# Patient Record
Sex: Female | Born: 1985 | Race: Black or African American | Hispanic: No | Marital: Single | Smoking: Never smoker
Health system: Southern US, Community
[De-identification: ages and names within clinical notes are randomized; demographics above are authoritative.]

## PROBLEM LIST (undated history)

## (undated) DIAGNOSIS — Z87898 Personal history of other specified conditions: Secondary | ICD-10-CM

## (undated) DIAGNOSIS — E1169 Type 2 diabetes mellitus with other specified complication: Secondary | ICD-10-CM

## (undated) DIAGNOSIS — E669 Obesity, unspecified: Secondary | ICD-10-CM

## (undated) DIAGNOSIS — B019 Varicella without complication: Secondary | ICD-10-CM

## (undated) DIAGNOSIS — I10 Essential (primary) hypertension: Secondary | ICD-10-CM

## (undated) HISTORY — DX: Obesity, unspecified: E66.9

## (undated) HISTORY — DX: Personal history of other specified conditions: Z87.898

## (undated) HISTORY — DX: Type 2 diabetes mellitus with other specified complication: E11.69

## (undated) HISTORY — DX: Varicella without complication: B01.9

## (undated) HISTORY — DX: Essential (primary) hypertension: I10

## (undated) HISTORY — PX: NO PAST SURGERIES: SHX2092

---

## 2012-07-29 ENCOUNTER — Encounter: Payer: Self-pay | Admitting: Family Medicine

## 2012-10-01 ENCOUNTER — Encounter: Payer: Self-pay | Admitting: Family Medicine

## 2012-10-01 ENCOUNTER — Ambulatory Visit (INDEPENDENT_AMBULATORY_CARE_PROVIDER_SITE_OTHER): Payer: 59 | Admitting: Family Medicine

## 2012-10-01 VITALS — BP 130/80 | HR 93 | Temp 98.5°F | Ht 65.25 in | Wt 204.6 lb

## 2012-10-01 DIAGNOSIS — Z0000 Encounter for general adult medical examination without abnormal findings: Secondary | ICD-10-CM | POA: Insufficient documentation

## 2012-10-01 LAB — LIPID PANEL
Cholesterol: 153 mg/dL (ref 0–200)
HDL: 33.3 mg/dL — ABNORMAL LOW (ref >39.00)
LDL Cholesterol: 107 mg/dL — ABNORMAL HIGH (ref 0–99)
Total CHOL/HDL Ratio: 5
Triglycerides: 64 mg/dL (ref 0.0–149.0)
VLDL: 12.8 mg/dL (ref 0.0–40.0)

## 2012-10-01 LAB — CBC WITH DIFFERENTIAL/PLATELET
Basophils Absolute: 0 10*3/uL (ref 0.0–0.1)
Basophils Relative: 0.3 % (ref 0.0–3.0)
Eosinophils Absolute: 0.1 10*3/uL (ref 0.0–0.7)
Eosinophils Relative: 1.1 % (ref 0.0–5.0)
HCT: 32.6 % — ABNORMAL LOW (ref 36.0–46.0)
Hemoglobin: 11 g/dL — ABNORMAL LOW (ref 12.0–15.0)
Lymphocytes Relative: 22 % (ref 12.0–46.0)
Lymphs Abs: 1.2 10*3/uL (ref 0.7–4.0)
MCHC: 33.9 g/dL (ref 30.0–36.0)
MCV: 88.4 fl (ref 78.0–100.0)
Monocytes Absolute: 0.4 10*3/uL (ref 0.1–1.0)
Monocytes Relative: 7 % (ref 3.0–12.0)
Neutro Abs: 3.7 10*3/uL (ref 1.4–7.7)
Neutrophils Relative %: 69.6 % (ref 43.0–77.0)
Platelets: 316 10*3/uL (ref 150.0–400.0)
RBC: 3.68 Mil/uL — ABNORMAL LOW (ref 3.87–5.11)
RDW: 13.5 % (ref 11.5–14.6)
WBC: 5.3 10*3/uL (ref 4.5–10.5)

## 2012-10-01 LAB — BASIC METABOLIC PANEL
BUN: 10 mg/dL (ref 6–23)
CO2: 25 mEq/L (ref 19–32)
Calcium: 9 mg/dL (ref 8.4–10.5)
Chloride: 104 mEq/L (ref 96–112)
Creatinine, Ser: 0.9 mg/dL (ref 0.4–1.2)
GFR: 101.02 mL/min (ref >60.00)
Glucose, Bld: 117 mg/dL — ABNORMAL HIGH (ref 70–99)
Potassium: 3.4 mEq/L — ABNORMAL LOW (ref 3.5–5.1)
Sodium: 137 mEq/L (ref 135–145)

## 2012-10-01 LAB — HEPATIC FUNCTION PANEL
ALT: 23 U/L (ref 0–35)
AST: 23 U/L (ref 0–37)
Albumin: 3.8 g/dL (ref 3.5–5.2)
Alkaline Phosphatase: 56 U/L (ref 39–117)
Bilirubin, Direct: 0 mg/dL (ref 0.0–0.3)
Total Bilirubin: 0.4 mg/dL (ref 0.3–1.2)
Total Protein: 7.6 g/dL (ref 6.0–8.3)

## 2012-10-01 LAB — TSH: TSH: 0.5 u[IU]/mL (ref 0.35–5.50)

## 2012-10-01 LAB — HEMOGLOBIN A1C: Hgb A1c MFr Bld: 4.9 % (ref 4.6–6.5)

## 2012-10-01 NOTE — Assessment & Plan Note (Signed)
Pt's PE WNL w/ exception of obesity.  Check labs.  UTD on GYN.  Anticipatory guidance provided.  

## 2012-10-01 NOTE — Patient Instructions (Addendum)
Follow up in 6 months to recheck BP We'll notify you of your lab results and fax your form Try and limit the salt in your diet (will improve BP) and get regular exercise Call with any questions or concerns Keep up the good work! Welcome!  We're glad to have you!!!

## 2012-10-01 NOTE — Progress Notes (Signed)
  Subjective:    Patient ID: Laura Miller, female    DOB: 1985-10-28, 27 y.o.   MRN: [id number]  HPI New to establish.  Previous MD- none.  GYN- Tamela Oddi  CPE- no concerns   Review of Systems Patient reports no vision/ hearing changes, adenopathy,fever, weight change,  persistant/recurrent hoarseness , swallowing issues, chest pain, palpitations, edema, persistant/recurrent cough, hemoptysis, dyspnea (rest/exertional/paroxysmal nocturnal), gastrointestinal bleeding (melena, rectal bleeding), abdominal pain, significant heartburn, bowel changes, GU symptoms (dysuria, hematuria, incontinence), Gyn symptoms (abnormal  bleeding, pain),  syncope, focal weakness, memory loss, numbness & tingling, skin/hair/nail changes, abnormal bruising or bleeding, anxiety, or depression.     Objective:   Physical Exam General Appearance:    Alert, cooperative, no distress, appears stated age. overweight  Head:    Normocephalic, without obvious abnormality, atraumatic  Eyes:    PERRL, conjunctiva/corneas clear, EOM's intact, fundi    benign, both eyes  Ears:    Normal TM's and external ear canals, both ears  Nose:   Nares normal, septum midline, mucosa normal, no drainage    or sinus tenderness  Throat:   Lips, mucosa, and tongue normal; teeth and gums normal  Neck:   Supple, symmetrical, trachea midline, no adenopathy;    Thyroid: no enlargement/tenderness/nodules  Back:     Symmetric, no curvature, ROM normal, no CVA tenderness  Lungs:     Clear to auscultation bilaterally, respirations unlabored  Chest Wall:    No tenderness or deformity   Heart:    Regular rate and rhythm, S1 and S2 normal, no murmur, rub   or gallop  Breast Exam:    Deferred to GYN  Abdomen:     Soft, non-tender, bowel sounds active all four quadrants,    no masses, no organomegaly  Genitalia:    Deferred to GYN  Rectal:    Extremities:   Extremities normal, atraumatic, no cyanosis or edema  Pulses:   2+ and symmetric all  extremities  Skin:   Skin color, texture, turgor normal, no rashes or lesions  Lymph nodes:   Cervical, supraclavicular, and axillary nodes normal  Neurologic:   CNII-XII intact, normal strength, sensation and reflexes    throughout          Assessment & Plan:

## 2012-10-02 LAB — NICOTINE/COTININE METABOLITES: Cotinine: <10 ng/mL

## 2012-10-04 ENCOUNTER — Encounter: Payer: Self-pay | Admitting: *Deleted

## 2012-10-04 LAB — VITAMIN D 1,25 DIHYDROXY
Vitamin D 1, 25 (OH)2 Total: 67 pg/mL (ref 18–72)
Vitamin D2 1, 25 (OH)2: <8 pg/mL
Vitamin D3 1, 25 (OH)2: 67 pg/mL

## 2012-10-07 ENCOUNTER — Encounter: Payer: Self-pay | Admitting: Obstetrics & Gynecology

## 2012-10-07 ENCOUNTER — Ambulatory Visit (INDEPENDENT_AMBULATORY_CARE_PROVIDER_SITE_OTHER): Payer: 59 | Admitting: Obstetrics & Gynecology

## 2012-10-07 VITALS — BP 134/82 | HR 96 | Temp 98.0°F | Ht 65.0 in | Wt 204.0 lb

## 2012-10-07 DIAGNOSIS — Z975 Presence of (intrauterine) contraceptive device: Secondary | ICD-10-CM

## 2012-10-07 DIAGNOSIS — Z3202 Encounter for pregnancy test, result negative: Secondary | ICD-10-CM

## 2012-10-07 DIAGNOSIS — Z30430 Encounter for insertion of intrauterine contraceptive device: Secondary | ICD-10-CM

## 2012-10-07 LAB — POCT URINE PREGNANCY: Preg Test, Ur: NEGATIVE

## 2012-10-07 MED ORDER — DOXYCYCLINE HYCLATE 50 MG PO CAPS: 100.0000 mg | ORAL_CAPSULE | Freq: Two times a day (BID) | ORAL | Status: DC

## 2012-10-07 NOTE — Progress Notes (Signed)
IUD Insertion Procedure Note  Pre-operative Diagnosis: Desires contraception  Post-operative Diagnosis: same  Indications: contraception  Procedure Details  Urine pregnancy test was done and result was negative.  The risks (including infection, bleeding, pain, and uterine perforation) and benefits of the procedure were explained to the patient and Written informed consent was obtained.    Cervix cleansed with Betadine. Uterus sounded to 7 cm. IUD inserted . String visible and trimmed. Patient tolerated procedure well.  A Skyla IUD was placed.    Condition: Stable  Complications: None  Plan:  The patient was advised to call for any fever or for prolonged or severe pain or bleeding. She was advised to use OTC analgesics as needed for mild to moderate pain.

## 2012-11-14 ENCOUNTER — Encounter: Payer: Self-pay | Admitting: Obstetrics & Gynecology

## 2012-11-14 ENCOUNTER — Ambulatory Visit (INDEPENDENT_AMBULATORY_CARE_PROVIDER_SITE_OTHER): Payer: 59 | Admitting: Obstetrics & Gynecology

## 2012-11-14 VITALS — BP 144/81 | HR 103 | Temp 98.5°F | Ht 65.0 in | Wt 205.2 lb

## 2012-11-14 DIAGNOSIS — Z30431 Encounter for routine checking of intrauterine contraceptive device: Secondary | ICD-10-CM

## 2012-11-14 NOTE — Patient Instructions (Signed)
Intrauterine Device Insertion Care After Refer to this sheet in the next few weeks. These instructions provide you with information on caring for yourself after your procedure. Your caregiver may also give you more specific instructions. Your treatment has been planned according to current medical practices, but problems sometimes occur. Call your caregiver if you have any problems or questions after your procedure. HOME CARE INSTRUCTIONS   Only take over-the-counter or prescription medicines for pain, discomfort, or fever as directed by your caregiver. Do not use aspirin. This may increase bleeding.  Check your IUD to make sure it is in place before you resume sexual activity. You should be able to feel the strings. If you cannot feel the strings, something may be wrong. The IUD may have fallen out of the uterus, or the uterus may have been punctured (perforated) during placement. Also, if the strings are getting longer, it may mean that the IUD is being forced out of the uterus. You no longer have full protection from pregnancy if any of these problems occur.  You may resume sexual intercourse if you are not having problems with the IUD. The IUD is considered immediately effective.  You may resume normal activities.  Keep all follow-up appointments to be sure your IUD has remained in place. After the first exam, yearly exams are advised, unless you cannot feel the strings of your IUD.  Continue to check that the IUD is still in place by feeling for the strings after every menstrual period. SEEK MEDICAL CARE IF:   You have bleeding that is heavier or lasts longer than a normal menstrual cycle.  You have a fever.  You have increasing cramps or abdominal pain not relieved with medicine.  You have abdominal pain that does not seem to be related to the same area of earlier cramping and pain.  You are lightheaded, unusually weak, or faint.  You have abnormal vaginal discharge or  smells.  You have pain during sexual intercourse.  You cannot feel the IUD strings, or the IUD string has gotten longer.  You feel the IUD at the opening of the cervix in the vagina.  You think you are pregnant, or you miss your menstrual period.  The IUD string is hurting your sex partner. Document Released: 02/15/2011 Document Revised: 09/11/2011 Document Reviewed: 02/15/2011 Exit[employer] Patient Information 2013 [employer].  

## 2012-11-14 NOTE — Progress Notes (Signed)
Subjective:     Laura Miller is a 27 y.o. female here for follow up on IUD placement.  Current complaints:none.  Personal health questionnaire reviewed: not asked.   Gynecologic History Patient's last menstrual period was 11/08/2012. Contraception: IUD Last Pap: Oct 30,2013. Results were: normal Last mammogram: not indicated    The following portions of the patient's history were reviewed and updated as appropriate: allergies, current medications, past family history, past medical history, past social history, past surgical history and problem list.  Review of Systems Pertinent items are noted in HPI.    Objective:   SPEC: IUD strings seen Bimanual exam: uterus AV, NT; adnexa NT  Assessment:    Normal exam, s/p IUD placement   Plan:    Return prn

## 2012-11-15 ENCOUNTER — Ambulatory Visit: Payer: 59 | Admitting: Obstetrics & Gynecology

## 2012-11-18 ENCOUNTER — Ambulatory Visit: Payer: 59 | Admitting: Obstetrics & Gynecology

## 2013-04-07 ENCOUNTER — Ambulatory Visit (INDEPENDENT_AMBULATORY_CARE_PROVIDER_SITE_OTHER): Payer: 59 | Admitting: Family Medicine

## 2013-04-07 ENCOUNTER — Encounter: Payer: Self-pay | Admitting: Family Medicine

## 2013-04-07 VITALS — BP 126/82 | HR 76 | Temp 98.2°F | Resp 16 | Wt 201.2 lb

## 2013-04-07 DIAGNOSIS — E049 Nontoxic goiter, unspecified: Secondary | ICD-10-CM

## 2013-04-07 DIAGNOSIS — I10 Essential (primary) hypertension: Secondary | ICD-10-CM | POA: Insufficient documentation

## 2013-04-07 DIAGNOSIS — R030 Elevated blood-pressure reading, without diagnosis of hypertension: Secondary | ICD-10-CM

## 2013-04-07 DIAGNOSIS — IMO0001 Reserved for inherently not codable concepts without codable children: Secondary | ICD-10-CM

## 2013-04-07 DIAGNOSIS — E010 Iodine-deficiency related diffuse (endemic) goiter: Secondary | ICD-10-CM | POA: Insufficient documentation

## 2013-04-07 NOTE — Assessment & Plan Note (Signed)
New.  TSH was normal at last visit.  Get [place] to assess.  Will follow.

## 2013-04-07 NOTE — Patient Instructions (Addendum)
Schedule your complete physical in 6 months We'll call you with your [place] appt and notify you of the results Call with any questions or concerns You look great!  Keep it up! Happy Fall!!!

## 2013-04-07 NOTE — Progress Notes (Signed)
  Subjective:    Patient ID: Laura Miller, female    DOB: Nov 13, 1985, 27 y.o.   MRN: [id number]  HPI Elevated BP- normal today.  Pt has new job and feels that this made a huge difference in stress.  No CP, SOB, HAs, visual changes, edema.  'i feel good'.   Review of Systems For ROS see HPI     Objective:   Physical Exam  Vitals reviewed. Constitutional: She is oriented to person, place, and time. She appears well-developed and well-nourished. No distress.  HENT:  Head: Normocephalic and atraumatic.  Eyes: Conjunctivae and EOM are normal. Pupils are equal, round, and reactive to light.  Neck: Normal range of motion. Neck supple. Thyromegaly (diffuse enlargement w/out nodularity) present.  Cardiovascular: Normal rate, regular rhythm, normal heart sounds and intact distal pulses.   No murmur heard. Pulmonary/Chest: Effort normal and breath sounds normal. No respiratory distress.  Abdominal: Soft. She exhibits no distension. There is no tenderness.  Musculoskeletal: She exhibits no edema.  Lymphadenopathy:    She has no cervical adenopathy.  Neurological: She is alert and oriented to person, place, and time.  Skin: Skin is warm and dry.  Psychiatric: She has a normal mood and affect. Her behavior is normal.          Assessment & Plan:

## 2013-04-07 NOTE — Assessment & Plan Note (Signed)
BP normal today.  Pt feels that getting her new job that is less stressful and more enjoyable has made a big difference.  Will continue to follow.

## 2013-04-08 ENCOUNTER — Ambulatory Visit (HOSPITAL_BASED_OUTPATIENT_CLINIC_OR_DEPARTMENT_OTHER)
Admission: RE | Admit: 2013-04-08 | Discharge: 2013-04-08 | Disposition: A | Discharge status: Home / Self Care | Payer: 59 | Source: Ambulatory Visit | Attending: Family Medicine | Admitting: Family Medicine

## 2013-04-08 ENCOUNTER — Ambulatory Visit (HOSPITAL_BASED_OUTPATIENT_CLINIC_OR_DEPARTMENT_OTHER): Payer: 59

## 2013-04-08 DIAGNOSIS — E010 Iodine-deficiency related diffuse (endemic) goiter: Secondary | ICD-10-CM

## 2013-04-08 DIAGNOSIS — E049 Nontoxic goiter, unspecified: Secondary | ICD-10-CM | POA: Insufficient documentation

## 2013-05-08 ENCOUNTER — Other Ambulatory Visit: Payer: Self-pay

## 2014-08-12 ENCOUNTER — Encounter: Payer: 59 | Admitting: Family Medicine

## 2014-11-04 ENCOUNTER — Encounter: Payer: Self-pay | Admitting: Family Medicine

## 2015-02-03 IMAGING — US US SOFT TISSUE HEAD/NECK
1 series · 14 of 17 positions shown · non-contrast
Comparison: None.

CLINICAL DATA: Thyromegaly. Patient is 26 years old.

EXAM:
THYROID ULTRASOUND
TECHNIQUE: Ultrasound examination of the thyroid gland and adjacent soft
tissues was performed.

[Series 1: us soft tissue head/neck · 0.08mm/px · 14 of 17 slices shown]
[im 1/17]
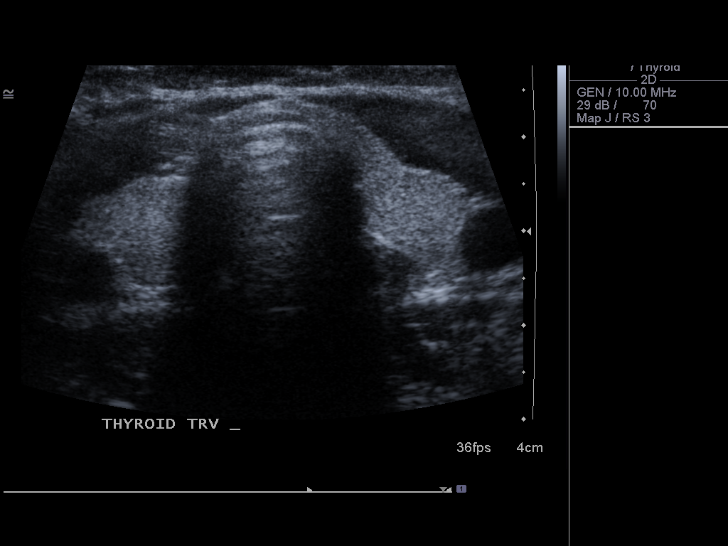
[im 2/17]
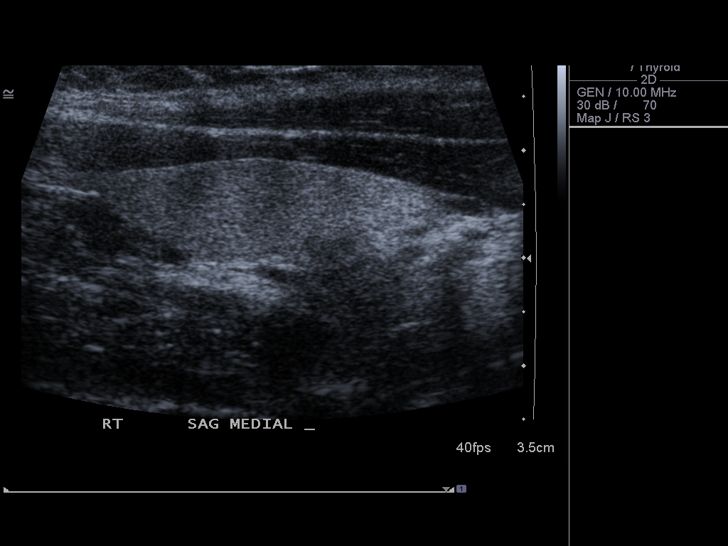
[im 4/17]
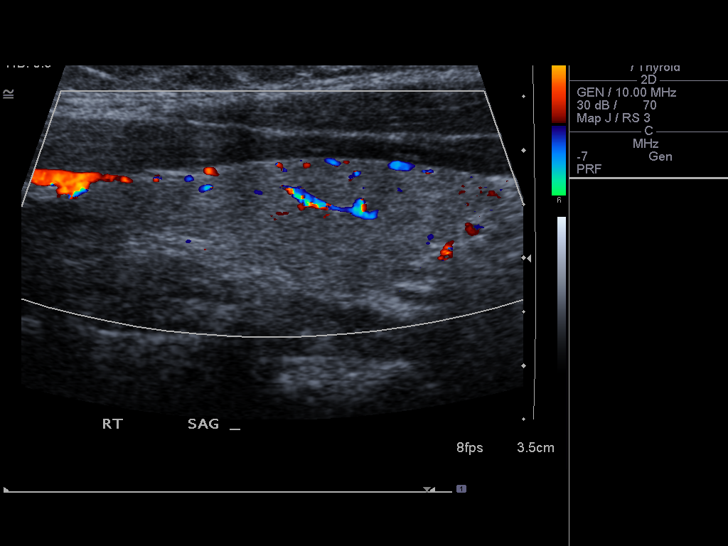
[im 5/17]
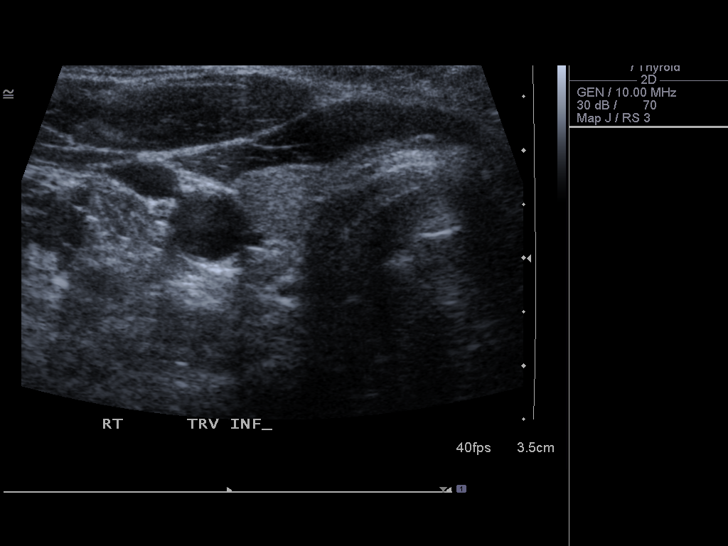
[im 6/17]
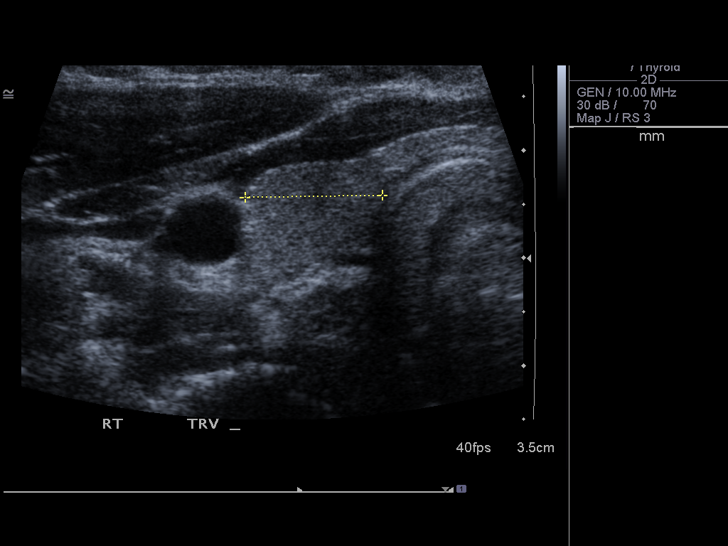
[im 7/17]
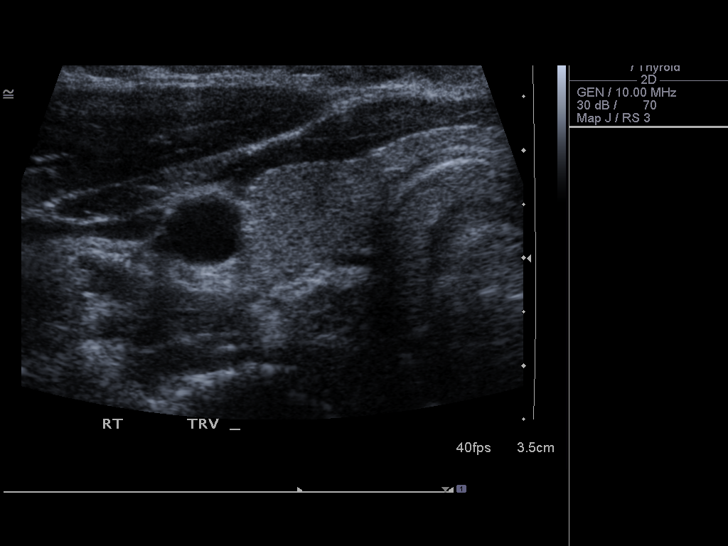
[im 8/17]
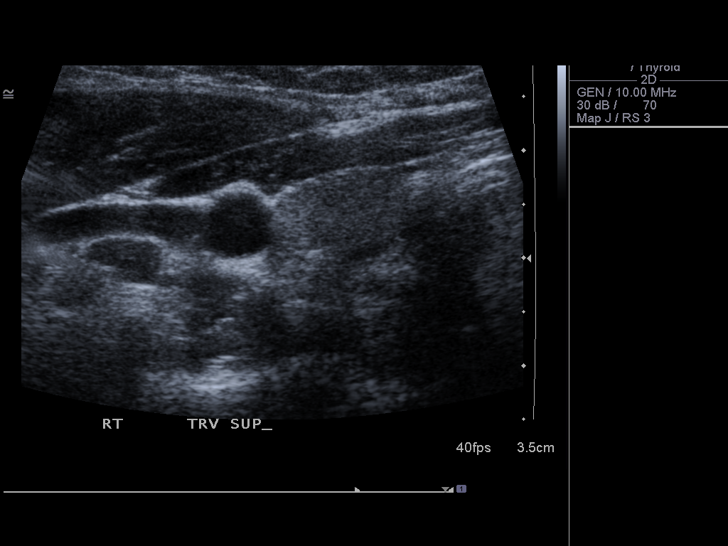
[im 10/17]
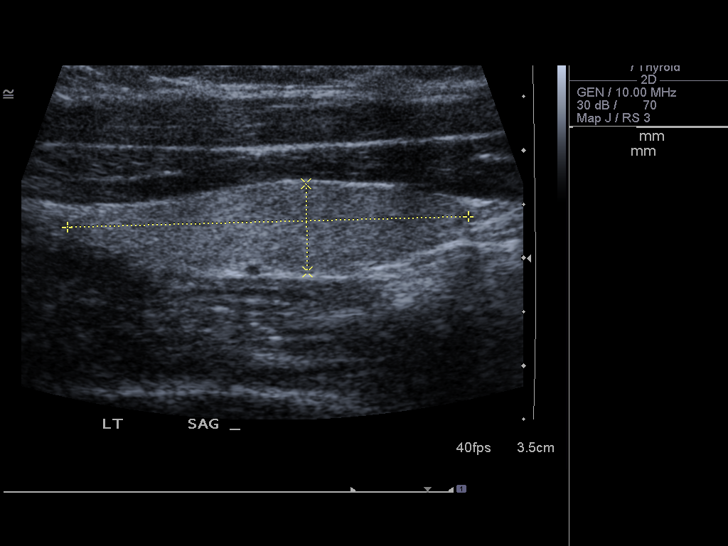
[im 11/17]
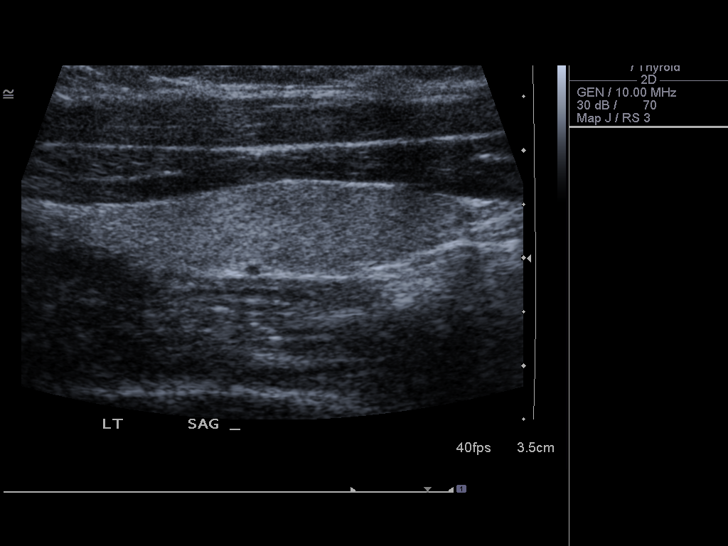
[im 12/17]
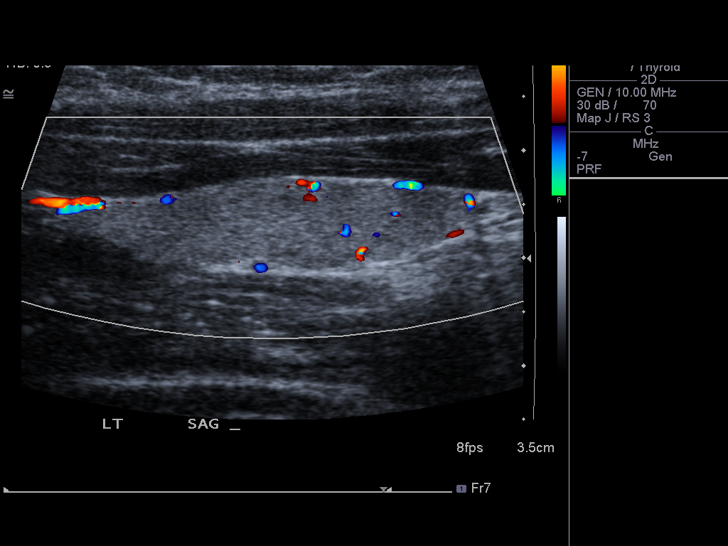
[im 13/17]
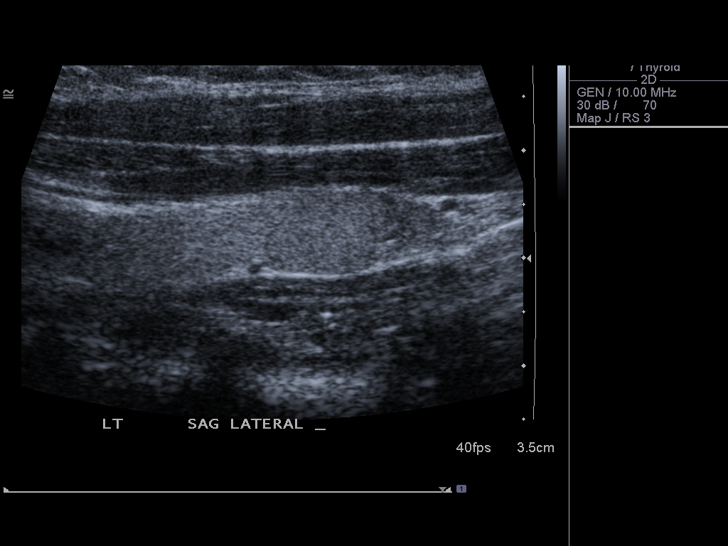
[im 14/17]
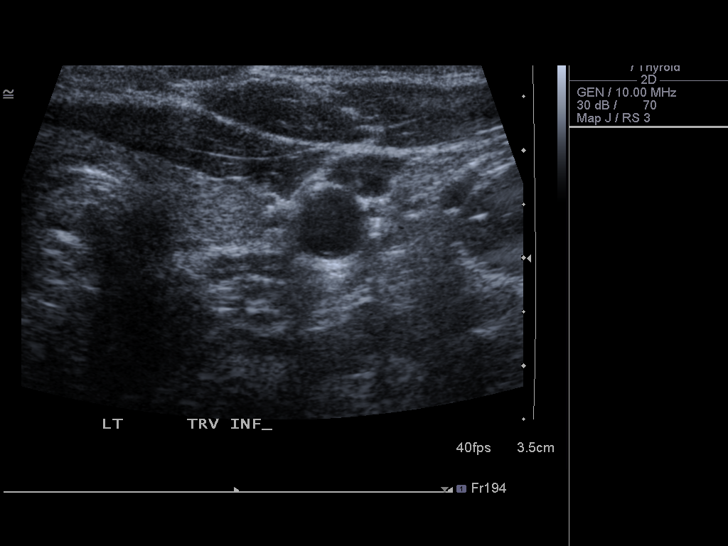
[im 16/17]
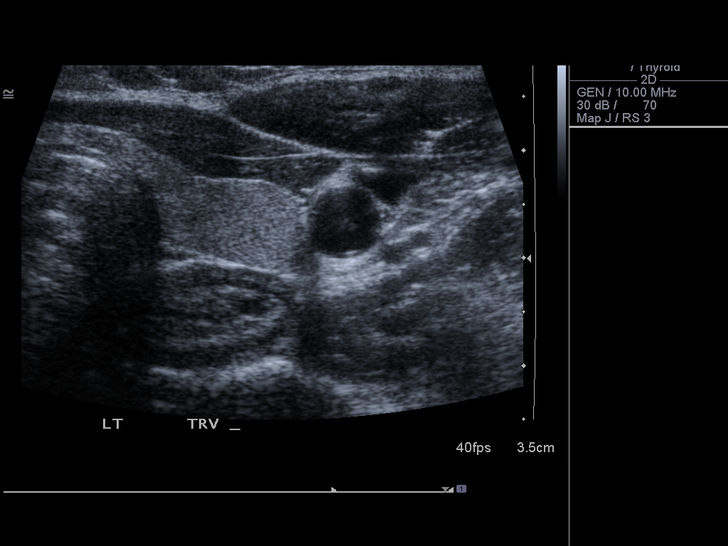
[im 17/17]
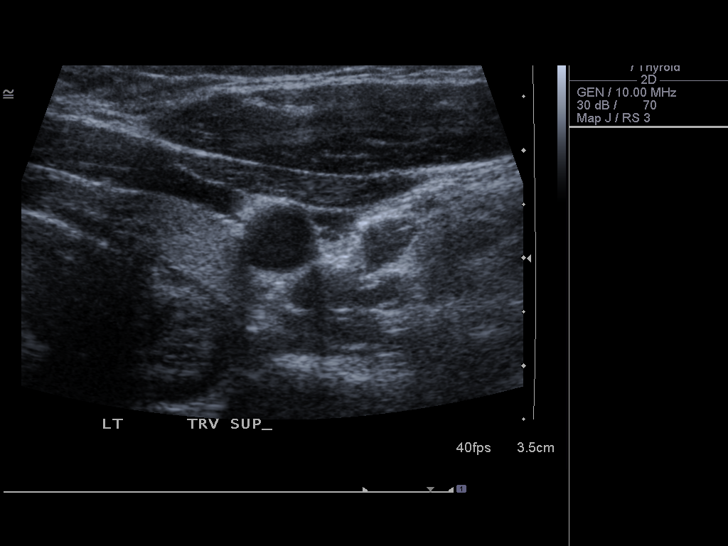

[14 of 17 positions shown; findings below may reference images not displayed]

FINDINGS: Right thyroid lobe

Measurements: 3.7 x 1.2 x 1.3 cm apparent. Homogeneous echotexture.
No nodules visualized.

Left thyroid lobe

Measurements: 3.7 x 0.8 x 1.3 cm. Homogeneous echotexture. No
nodules visualized.

Isthmus

Thickness: 0.2 cm.  No nodules visualized.

Lymphadenopathy

None visualized.
IMPRESSION: Normal thyroid ultrasound.

## 2015-04-26 ENCOUNTER — Telehealth: Payer: Self-pay | Admitting: *Deleted

## 2015-04-26 NOTE — Telephone Encounter (Signed)
Patient is interested in having her Skyla IUD removed. Patient states she is not interested in any other form of birth control at this time. Patient has been scheduled for 04-29-15 @ 11:15 am with Orvilla Cornwallachelle Denney, CNM.

## 2015-04-29 ENCOUNTER — Ambulatory Visit (INDEPENDENT_AMBULATORY_CARE_PROVIDER_SITE_OTHER): Payer: PRIVATE HEALTH INSURANCE | Admitting: Certified Nurse Midwife

## 2015-04-29 VITALS — BP 133/87 | HR 84 | Wt 214.0 lb

## 2015-04-29 DIAGNOSIS — Z01818 Encounter for other preprocedural examination: Secondary | ICD-10-CM

## 2015-04-29 DIAGNOSIS — Z30432 Encounter for removal of intrauterine contraceptive device: Secondary | ICD-10-CM | POA: Diagnosis not present

## 2015-04-29 NOTE — Progress Notes (Signed)
Patient ID: Laura KapurRebecca Miller, female   DOB: 06/23/1986, 29 y.o.   MRN: [id number]   Chief Complaint  Patient presents with  . Contraception    iud removal    HPI Laura KapurRebecca Miller is a 29 y.o. female.  Here for IUD removal.  Does not desire any more contraception at this time.   Needs annual exam.  States she is not sexually active at this time.   HPI  Past Medical History  Diagnosis Date  . Chicken pox     Past Surgical History  Procedure Laterality Date  . No past surgeries      Family History  Problem Relation Age of Onset  . Diabetes Father   . Hypertension Maternal Grandmother   . Diabetes Maternal Grandmother   . Hypertension Maternal Grandfather   . Hypertension Paternal Grandmother   . Diabetes Paternal Grandmother   . Hypertension Paternal Grandfather     Social History Social History  Substance Use Topics  . Smoking status: Never Smoker   . Smokeless tobacco: Never Used  . Alcohol Use: No    No Known Allergies  No current outpatient prescriptions on file.   No current facility-administered medications for this visit.    Review of Systems Review of Systems Constitutional: negative for fatigue and weight loss Respiratory: negative for cough and wheezing Cardiovascular: negative for chest pain, fatigue and palpitations Gastrointestinal: negative for abdominal pain and change in bowel habits Genitourinary:negative Integument/breast: negative for nipple discharge Musculoskeletal:negative for myalgias Neurological: negative for gait problems and tremors Behavioral/Psych: negative for abusive relationship, depression Endocrine: negative for temperature intolerance     Blood pressure 133/87, pulse 84, weight 214 lb (97.07 kg).  Physical Exam Physical Exam General:   alert  Skin:   no rash or abnormalities  Lungs:   clear to auscultation bilaterally  Heart:   regular rate and rhythm, S1, S2 normal, no murmur, click, rub or gallop  Breasts:   deferred   Abdomen:  normal findings: no organomegaly, soft, non-tender and no hernia  Pelvis:  External genitalia: normal general appearance Urinary system: urethral meatus normal and bladder without fullness, nontender Vaginal: normal without tenderness, induration or masses Cervix: normal appearance Adnexa: normal bimanual exam Uterus: anteverted and non-tender, normal size   Procedure note:  Speculum placed in sterile manner.  Cervix visualized.  IUD strings visualized and grasped with forceps.  IUD removed intact.  Patient tolerated the procedure well.  50% of 15 min visit spent on counseling and coordination of care.   Data Reviewed Previous medical hx, meds  Assessment     IUD removed intact     Plan    Orders Placed This Encounter  Procedures  . POCT urine pregnancy   No orders of the defined types were placed in this encounter.     Possible management options include: other contraception Follow up with annual exam in a few weeks.

## 2015-05-04 ENCOUNTER — Ambulatory Visit: Payer: PRIVATE HEALTH INSURANCE | Admitting: Certified Nurse Midwife

## 2015-05-18 ENCOUNTER — Ambulatory Visit: Payer: PRIVATE HEALTH INSURANCE | Admitting: Certified Nurse Midwife

## 2015-06-01 ENCOUNTER — Ambulatory Visit (INDEPENDENT_AMBULATORY_CARE_PROVIDER_SITE_OTHER): Payer: PRIVATE HEALTH INSURANCE | Admitting: Certified Nurse Midwife

## 2015-06-01 ENCOUNTER — Encounter: Payer: Self-pay | Admitting: Certified Nurse Midwife

## 2015-06-01 VITALS — BP 123/85 | HR 78 | Temp 98.5°F | Ht 65.0 in | Wt 217.0 lb

## 2015-06-01 DIAGNOSIS — Z113 Encounter for screening for infections with a predominantly sexual mode of transmission: Secondary | ICD-10-CM

## 2015-06-01 DIAGNOSIS — Z01419 Encounter for gynecological examination (general) (routine) without abnormal findings: Secondary | ICD-10-CM | POA: Diagnosis not present

## 2015-06-01 LAB — CBC WITH DIFFERENTIAL/PLATELET
Basophils Absolute: 0 10*3/uL (ref 0.0–0.1)
Basophils Relative: 0 % (ref 0–1)
Eosinophils Absolute: 0.1 10*3/uL (ref 0.0–0.7)
Eosinophils Relative: 2 % (ref 0–5)
HCT: 33.6 % — ABNORMAL LOW (ref 36.0–46.0)
Hemoglobin: 11.8 g/dL — ABNORMAL LOW (ref 12.0–15.0)
Lymphocytes Relative: 26 % (ref 12–46)
Lymphs Abs: 1.4 10*3/uL (ref 0.7–4.0)
MCH: 29.9 pg (ref 26.0–34.0)
MCHC: 35.1 g/dL (ref 30.0–36.0)
MCV: 85.1 fL (ref 78.0–100.0)
MPV: 9.2 fL (ref 8.6–12.4)
Monocytes Absolute: 0.4 10*3/uL (ref 0.1–1.0)
Monocytes Relative: 8 % (ref 3–12)
Neutro Abs: 3.5 10*3/uL (ref 1.7–7.7)
Neutrophils Relative %: 64 % (ref 43–77)
Platelets: 309 10*3/uL (ref 150–400)
RBC: 3.95 MIL/uL (ref 3.87–5.11)
RDW: 13.6 % (ref 11.5–15.5)
WBC: 5.4 10*3/uL (ref 4.0–10.5)

## 2015-06-01 LAB — TSH: TSH: 0.938 u[IU]/mL (ref 0.350–4.500)

## 2015-06-01 NOTE — Progress Notes (Signed)
Patient ID: Houa Nie, female   DOB: August 12, 1985, 29 y.o.   MRN: [id number]   Subjective:        Dauna Ziska is a 29 y.o. female here for a routine exam.  Current complaints: none.  Periods are monthly and normal, LMP Oct. 29th.    Desires full STD screening exam.   Personal health questionnaire:  Is patient Ashkenazi Jewish, have a family history of breast and/or ovarian cancer: Yes, MGA BCA Is there a family history of uterine cancer diagnosed at age < 62, gastrointestinal cancer, urinary tract cancer, family member who is a Personnel officer syndrome-associated carrier: no Is the patient overweight and hypertensive, family history of diabetes, personal history of gestational diabetes, preeclampsia or PCOS: yes Is patient over 75, have PCOS,  family history of premature CHD under age 65, diabetes, smoke, have hypertension or peripheral artery disease:  Family Sister & father, MGM & PGM DM At any time, has a partner hit, kicked or otherwise hurt or frightened you?: no Over the past 2 weeks, have you felt down, depressed or hopeless?: no Over the past 2 weeks, have you felt little interest or pleasure in doing things?:no   Gynecologic History Patient's last menstrual period was 05/01/2015. Contraception: abstinence Last Pap: unknown. Results were: normal according to the patient Last mammogram: N/A.   Obstetric History OB History  No data available    Past Medical History  Diagnosis Date  . Chicken pox     Past Surgical History  Procedure Laterality Date  . No past surgeries       Current outpatient prescriptions:  .  cholecalciferol (VITAMIN D) 1000 UNITS tablet, Take 1,000 Units by mouth daily., Disp: , Rfl:  .  ferrous sulfate 325 (65 FE) MG tablet, Take 325 mg by mouth daily with breakfast., Disp: , Rfl:  No Known Allergies  Social History  Substance Use Topics  . Smoking status: Never Smoker   . Smokeless tobacco: Never Used  . Alcohol Use: No    Family History   Problem Relation Age of Onset  . Diabetes Father   . Hypertension Maternal Grandmother   . Diabetes Maternal Grandmother   . Hypertension Maternal Grandfather   . Hypertension Paternal Grandmother   . Diabetes Paternal Grandmother   . Hypertension Paternal Grandfather       Review of Systems  Constitutional: negative for fatigue and weight loss Respiratory: negative for cough and wheezing Cardiovascular: negative for chest pain, fatigue and palpitations Gastrointestinal: negative for abdominal pain and change in bowel habits Musculoskeletal:negative for myalgias Neurological: negative for gait problems and tremors Behavioral/Psych: negative for abusive relationship, depression Endocrine: negative for temperature intolerance   Genitourinary:negative for abnormal menstrual periods, genital lesions, hot flashes, sexual problems and vaginal discharge Integument/breast: negative for breast lump, breast tenderness, nipple discharge and skin lesion(s)    Objective:       BP 123/85 mmHg  Pulse 78  Temp(Src) 98.5 F (36.9 C)  Ht  (1.651 m)  Wt 217 lb (98.431 kg)  BMI 36.11 kg/m2  LMP 05/01/2015 General:   alert  Skin:   no rash or abnormalities  Lungs:   clear to auscultation bilaterally  Heart:   regular rate and rhythm, S1, S2 normal, no murmur, click, rub or gallop  Breasts:   normal without suspicious masses, skin or nipple changes or axillary nodes  Abdomen:  normal findings: no organomegaly, soft, non-tender and no hernia  Pelvis:  External genitalia: normal general appearance Urinary system: urethral  meatus normal and bladder without fullness, nontender Vaginal: normal without tenderness, induration or masses Cervix: normal appearance Adnexa: normal bimanual exam Uterus: anteverted and non-tender, normal size   Lab Review Urine pregnancy test Labs reviewed yes Radiologic studies reviewed no  50% of 30 min visit spent on counseling and coordination of care.    Assessment:    Healthy female exam.    STD screening exam.     Plan:    Education reviewed: calcium supplements, depression evaluation, low fat, low cholesterol diet, safe sex/STD prevention, self breast exams, skin cancer screening and weight bearing exercise. Contraception: abstinence. Follow up in: 1 year.   Meds ordered this encounter  Medications  . cholecalciferol (VITAMIN D) 1000 UNITS tablet    Sig: Take 1,000 Units by mouth daily.  . ferrous sulfate 325 (65 FE) MG tablet    Sig: Take 325 mg by mouth daily with breakfast.   Orders Placed This Encounter  Procedures  . HIV antibody (with reflex)  . Hepatitis B surface antigen  . RPR  . Hepatitis C antibody  . CBC with Differential/Platelet  . Comprehensive metabolic panel  . TSH  . Cholesterol, total  . Triglycerides  . HDL cholesterol

## 2015-06-02 LAB — COMPREHENSIVE METABOLIC PANEL
ALT: 19 U/L (ref 6–29)
AST: 18 U/L (ref 10–30)
Albumin: 4.2 g/dL (ref 3.6–5.1)
Alkaline Phosphatase: 62 U/L (ref 33–115)
BUN: 14 mg/dL (ref 7–25)
CO2: 27 mmol/L (ref 20–31)
Calcium: 9.4 mg/dL (ref 8.6–10.2)
Chloride: 102 mmol/L (ref 98–110)
Creat: 0.91 mg/dL (ref 0.50–1.10)
Glucose, Bld: 103 mg/dL — ABNORMAL HIGH (ref 65–99)
Potassium: 4.9 mmol/L (ref 3.5–5.3)
Sodium: 137 mmol/L (ref 135–146)
Total Bilirubin: 0.4 mg/dL (ref 0.2–1.2)
Total Protein: 7.4 g/dL (ref 6.1–8.1)

## 2015-06-02 LAB — CHOLESTEROL, TOTAL: Cholesterol: 170 mg/dL (ref 125–200)

## 2015-06-02 LAB — HIV ANTIBODY (ROUTINE TESTING W REFLEX): HIV 1&2 Ab, 4th Generation: NONREACTIVE

## 2015-06-02 LAB — HEPATITIS C ANTIBODY: HCV Ab: NEGATIVE

## 2015-06-02 LAB — HDL CHOLESTEROL: HDL: 38 mg/dL — ABNORMAL LOW (ref >=46)

## 2015-06-02 LAB — PAP IG W/ RFLX HPV ASCU

## 2015-06-02 LAB — RPR

## 2015-06-02 LAB — HEPATITIS B SURFACE ANTIGEN: Hepatitis B Surface Ag: NEGATIVE

## 2015-06-02 LAB — TRIGLYCERIDES: Triglycerides: 77 mg/dL (ref <150)

## 2016-06-01 ENCOUNTER — Ambulatory Visit: Payer: Self-pay | Admitting: Certified Nurse Midwife

## 2016-06-02 LAB — HM PAP SMEAR: HM Pap smear: NORMAL

## 2016-06-09 ENCOUNTER — Encounter: Payer: Self-pay | Admitting: Certified Nurse Midwife

## 2016-06-09 ENCOUNTER — Ambulatory Visit (INDEPENDENT_AMBULATORY_CARE_PROVIDER_SITE_OTHER): Payer: BLUE CROSS/BLUE SHIELD | Admitting: Certified Nurse Midwife

## 2016-06-09 VITALS — BP 134/83 | HR 93 | Ht 65.0 in | Wt 229.2 lb

## 2016-06-09 DIAGNOSIS — Z01419 Encounter for gynecological examination (general) (routine) without abnormal findings: Secondary | ICD-10-CM | POA: Diagnosis not present

## 2016-06-09 DIAGNOSIS — Z3009 Encounter for other general counseling and advice on contraception: Secondary | ICD-10-CM | POA: Diagnosis not present

## 2016-06-09 NOTE — Progress Notes (Signed)
Subjective:        Laura KapurRebecca Calderwood is a 30 y.o. female here for a routine exam.  Periods are monthly and normal, LMP Nov. 29th.     Declines full STD screening exam. Desires to go back on birth control, not sexually active: just to help her periods.    Personal health questionnaire:  Is patient Ashkenazi Jewish, have a family history of breast and/or ovarian cancer: Yes, MGA BCA Is there a family history of uterine cancer diagnosed at age < 1850, gastrointestinal cancer, urinary tract cancer, family member who is a Personnel officerLynch syndrome-associated carrier: no Is the patient overweight and hypertensive, family history of diabetes, personal history of gestational diabetes, preeclampsia or PCOS: yes Is patient over 2755, have PCOS,  family history of premature CHD under age 30, diabetes, smoke, have hypertension or peripheral artery disease:  Family Sister & father, MGM & PGM DM At any time, has a partner hit, kicked or otherwise hurt or frightened you?: no Over the past 2 weeks, have you felt down, depressed or hopeless?: no Over the past 2 weeks, have you felt little interest or pleasure in doing things?:no  Gynecologic History Patient's last menstrual period was 06/01/2016 (exact date). Contraception: abstinence Last Pap: 06/01/15. Results were: normal Last mammogram: n/a.   Obstetric History OB History  No data available    Past Medical History:  Diagnosis Date  . Chicken pox     Past Surgical History:  Procedure Laterality Date  . NO PAST SURGERIES       Current Outpatient Prescriptions:  .  cholecalciferol (VITAMIN D) 1000 UNITS tablet, Take 1,000 Units by mouth daily., Disp: , Rfl:  .  loratadine-pseudoephedrine (CLARITIN-D 24-HOUR) 10-240 MG 24 hr tablet, Take 1 tablet by mouth daily., Disp: , Rfl:  .  Multiple Vitamins-Minerals (MULTIVITAMIN WITH MINERALS) tablet, Take 1 tablet by mouth daily., Disp: , Rfl:  No Known Allergies  Social History  Substance Use Topics  .  Smoking status: Never Smoker  . Smokeless tobacco: Never Used  . Alcohol use No    Family History  Problem Relation Age of Onset  . Diabetes Father   . Hypertension Maternal Grandmother   . Diabetes Maternal Grandmother   . Hypertension Maternal Grandfather   . Hypertension Paternal Grandmother   . Diabetes Paternal Grandmother   . Hypertension Paternal Grandfather       Review of Systems  Constitutional: negative for fatigue and weight loss Respiratory: negative for cough and wheezing Cardiovascular: negative for chest pain, fatigue and palpitations Gastrointestinal: negative for abdominal pain and change in bowel habits Musculoskeletal:negative for myalgias Neurological: negative for gait problems and tremors Behavioral/Psych: negative for abusive relationship, depression Endocrine: negative for temperature intolerance    Genitourinary:negative for abnormal menstrual periods, genital lesions, hot flashes, sexual problems and vaginal discharge Integument/breast: negative for breast lump, breast tenderness, nipple discharge and skin lesion(s)    Objective:       BP 134/83   Pulse 93   Ht 5\' 5"  (1.651 m)   Wt 229 lb 3.2 oz (104 kg)   LMP 06/01/2016 (Exact Date) Comment: regular cycles  BMI 38.14 kg/m  General:   alert  Skin:   no rash or abnormalities  Lungs:   clear to auscultation bilaterally  Heart:   regular rate and rhythm, S1, S2 normal, no murmur, click, rub or gallop  Breasts:   normal without suspicious masses, skin or nipple changes or axillary nodes  Abdomen:  normal findings: no organomegaly,  soft, non-tender and no hernia obese  Pelvis:  External genitalia: normal general appearance Urinary system: urethral meatus normal and bladder without fullness, nontender Vaginal: normal without tenderness, induration or masses Cervix: normal appearance Adnexa: normal bimanual exam Uterus: anteverted and non-tender, normal size   Lab Review Urine pregnancy  test Labs reviewed yes Radiologic studies reviewed no  50% of 30 min visit spent on counseling and coordination of care.    Assessment:    Healthy female exam.   Contraception counseling  Plan:    Education reviewed: calcium supplements, depression evaluation, low fat, low cholesterol diet, safe sex/STD prevention, self breast exams, skin cancer screening and weight bearing exercise. Contraception: abstinence. Follow up in: 2 weeks with next period for Lyletta IUD weeks.   Meds ordered this encounter  Medications  . Multiple Vitamins-Minerals (MULTIVITAMIN WITH MINERALS) tablet    Sig: Take 1 tablet by mouth daily.  Marland Kitchen. loratadine-pseudoephedrine (CLARITIN-D 24-HOUR) 10-240 MG 24 hr tablet    Sig: Take 1 tablet by mouth daily.   Orders Placed This Encounter  Procedures  . NuSwab Vaginitis Plus (VG+)    Possible management options include: [place] Follow up as needed.

## 2016-06-13 LAB — CYTOLOGY - PAP: Diagnosis: NEGATIVE

## 2016-06-14 LAB — NUSWAB VAGINITIS PLUS (VG+)
BVAB 2: HIGH Score — AB
Candida albicans, NAA: NEGATIVE
Candida glabrata, NAA: NEGATIVE
Chlamydia trachomatis, NAA: NEGATIVE
Megasphaera 1: HIGH Score — AB
Neisseria gonorrhoeae, NAA: NEGATIVE
Trich vag by NAA: NEGATIVE

## 2016-06-16 ENCOUNTER — Other Ambulatory Visit: Payer: Self-pay | Admitting: Certified Nurse Midwife

## 2016-06-16 DIAGNOSIS — N760 Acute vaginitis: Principal | ICD-10-CM

## 2016-06-16 DIAGNOSIS — B9689 Other specified bacterial agents as the cause of diseases classified elsewhere: Secondary | ICD-10-CM

## 2016-06-16 MED ORDER — METRONIDAZOLE 500 MG PO TABS: 500.0000 mg | ORAL_TABLET | Freq: Two times a day (BID) | ORAL | 0 refills | Status: DC

## 2016-06-19 ENCOUNTER — Telehealth: Payer: Self-pay

## 2016-06-19 NOTE — Telephone Encounter (Signed)
Attempted to return call, phone was not accepting calls.

## 2016-07-06 ENCOUNTER — Ambulatory Visit: Payer: BLUE CROSS/BLUE SHIELD | Admitting: Certified Nurse Midwife

## 2016-08-24 ENCOUNTER — Encounter: Payer: Self-pay | Admitting: Obstetrics & Gynecology

## 2016-08-24 ENCOUNTER — Ambulatory Visit (INDEPENDENT_AMBULATORY_CARE_PROVIDER_SITE_OTHER): Payer: BLUE CROSS/BLUE SHIELD | Admitting: Obstetrics & Gynecology

## 2016-08-24 VITALS — BP 150/100 | HR 113 | Ht 65.0 in | Wt 228.0 lb

## 2016-08-24 DIAGNOSIS — Z30430 Encounter for insertion of intrauterine contraceptive device: Secondary | ICD-10-CM | POA: Diagnosis not present

## 2016-08-24 MED ORDER — LEVONORGESTREL 18.6 MCG/DAY IU IUD
INTRAUTERINE_SYSTEM | Freq: Once | INTRAUTERINE | Status: AC
  Administered 2016-08-24: 16:00:00 via INTRAUTERINE

## 2016-08-24 NOTE — Progress Notes (Signed)
Pt presents for Lilleta insertion for contraception and to help regulate cycles.

## 2016-08-24 NOTE — Patient Instructions (Signed)

## 2016-08-24 NOTE — Progress Notes (Signed)
    GYNECOLOGY OFFICE PROCEDURE NOTE  Laura Miller is a 31 y.o. G0P0000 here for Liletta IUD insertion for contraception and period regulation. No GYN concerns.  Last pap smear was on 06/09/16 and was normal.  IUD Insertion Procedure Note Patient identified, informed consent performed, consent signed.   Discussed risks of irregular bleeding, cramping, infection, malpositioning or misplacement of the IUD outside the uterus which may require further procedure such as laparoscopy. Time out was performed.  Urine pregnancy test negative.  Speculum placed in the vagina.  Cervix visualized.  Cleaned with Betadine x 2.  Grasped anteriorly with a single tooth tenaculum.  Uterus sounded to 9 cm.  Liletta IUD placed per manufacturer's recommendations. There was some external os stenosis that was breached using dilators prior to IUD placement. Rest of placement was unremarkable.  Strings trimmed to 3 cm. Tenaculum was removed, good hemostasis noted.  Patient tolerated procedure well.   Patient was given post-procedure instructions.  She was advised to have backup contraception for one week.  Patient was also asked to check IUD strings periodically and follow up in 4 weeks for IUD check.   Laura CollinsUGONNA  Rashana Andrew, MD, FACOG Attending Obstetrician & Gynecologist, [facility], [facility]

## 2016-10-02 ENCOUNTER — Ambulatory Visit (INDEPENDENT_AMBULATORY_CARE_PROVIDER_SITE_OTHER): Payer: BLUE CROSS/BLUE SHIELD | Admitting: Obstetrics & Gynecology

## 2016-10-02 ENCOUNTER — Encounter: Payer: Self-pay | Admitting: Obstetrics & Gynecology

## 2016-10-02 VITALS — BP 139/85 | HR 105 | Ht 65.0 in | Wt 232.0 lb

## 2016-10-02 DIAGNOSIS — Z30431 Encounter for routine checking of intrauterine contraceptive device: Secondary | ICD-10-CM | POA: Diagnosis not present

## 2016-10-02 NOTE — Patient Instructions (Signed)
Return to clinic for any scheduled appointments or for any gynecologic concerns as needed.   

## 2016-10-02 NOTE — Progress Notes (Signed)
     GYNECOLOGY OFFICE PROGRESS NOTE  History:  31 y.o. G0P0000 here today for today for IUD string check; Liletta IUD was placed  08/24/2016. No complaints about the IUD, no concerning side effects.  The following portions of the patient's history were reviewed and updated as appropriate: allergies, current medications, past family history, past medical history, past social history, past surgical history and problem list. Last pap smear on 06/09/2016 was normal.  Review of Systems:  Pertinent items are noted in HPI.   Objective:  Physical Exam Blood pressure 139/85, pulse (!) 105, height  (1.651 m), weight 232 lb (105.2 kg), last menstrual period 09/17/2016. CONSTITUTIONAL: Well-developed, well-nourished female in no acute distress.  HENT:  Normocephalic, atraumatic. External right and left ear normal. Oropharynx is clear and moist EYES: Conjunctivae and EOM are normal. Pupils are equal, round, and reactive to light. No scleral icterus.  NECK: Normal range of motion, supple, no masses CARDIOVASCULAR: Normal heart rate noted RESPIRATORY: Effort and breath sounds normal, no problems with respiration noted ABDOMEN: Soft, no distention noted.   PELVIC: Normal appearing external genitalia; normal appearing vaginal mucosa and cervix.  IUD strings visualized, about 2 cm in length outside cervix.   Assessment & Plan:  Normal IUD check. Patient to keep IUD in place for five years; can come in for removal if she desires pregnancy within the next five years. Routine preventative health maintenance measures emphasized.    Jaynie Collins, MD, FACOG Attending Obstetrician & Gynecologist, [facility]

## 2017-03-06 ENCOUNTER — Encounter: Payer: Self-pay | Admitting: Obstetrics

## 2017-03-06 ENCOUNTER — Ambulatory Visit (INDEPENDENT_AMBULATORY_CARE_PROVIDER_SITE_OTHER): Payer: BLUE CROSS/BLUE SHIELD | Admitting: Obstetrics

## 2017-03-06 VITALS — BP 148/89 | HR 109 | Wt 222.8 lb

## 2017-03-06 DIAGNOSIS — Z3009 Encounter for other general counseling and advice on contraception: Secondary | ICD-10-CM

## 2017-03-06 DIAGNOSIS — Z113 Encounter for screening for infections with a predominantly sexual mode of transmission: Secondary | ICD-10-CM

## 2017-03-06 DIAGNOSIS — Z30011 Encounter for initial prescription of contraceptive pills: Secondary | ICD-10-CM

## 2017-03-06 DIAGNOSIS — Z30431 Encounter for routine checking of intrauterine contraceptive device: Secondary | ICD-10-CM

## 2017-03-06 DIAGNOSIS — Z30432 Encounter for removal of intrauterine contraceptive device: Secondary | ICD-10-CM

## 2017-03-06 DIAGNOSIS — N898 Other specified noninflammatory disorders of vagina: Secondary | ICD-10-CM

## 2017-03-06 MED ORDER — LO LOESTRIN FE 1 MG-10 MCG / 10 MCG PO TABS: 1.0000 | ORAL_TABLET | Freq: Every day | ORAL | 4 refills | Status: DC

## 2017-03-06 NOTE — Progress Notes (Signed)
Patient had a cycle 8/14- heavy cycle. 8/21-patient was able to feel strings. Patient states that since the 8/24 she has not felt the strings and the bleeding stopped.

## 2017-03-06 NOTE — Progress Notes (Signed)
    GYNECOLOGY OFFICE PROCEDURE NOTE  Laura KapurRebecca Miller is a 31 y.o. G0P0000 here for Liletta IUD removal.  IUD inserted in February 2018. Did fine until ~ 1 month ago with cramping and a very heavy cycle, and the IUD string did not feel the same.  Last pap smear was on 06-09-2016 and was normal. PE:          SSE:  IUD protruding through cervical os.  IUD grasped with long dressing forceps and removed, intact.(see note)  A/P:  Malpositioned IUD.  IUD removed.  IUD Removal  Patient identified, informed consent performed, consent signed.  Patient was in the dorsal lithotomy position, normal external genitalia was noted.  A speculum was placed in the patient's vagina, normal discharge was noted, no lesions. The cervix was visualized, no lesions, no abnormal discharge.  The  Liletta IUD was grasped and pulled using dressing forceps. The IUD was removed in its entirety.  Patient tolerated the procedure well.    Patient will use OCP's for contraception.  Routine preventative health maintenance measures emphasized.   Brock BadHARLES A. HARPER, MD, FACOG Attending Obstetrician & Gynecologist, [facility], [facility]

## 2017-03-08 ENCOUNTER — Other Ambulatory Visit: Payer: Self-pay | Admitting: Obstetrics

## 2017-03-08 DIAGNOSIS — Z30011 Encounter for initial prescription of contraceptive pills: Secondary | ICD-10-CM

## 2017-03-08 LAB — CERVICOVAGINAL ANCILLARY ONLY
Bacterial vaginitis: NEGATIVE
Candida vaginitis: POSITIVE — AB
Chlamydia: NEGATIVE
Neisseria Gonorrhea: NEGATIVE
Trichomonas: NEGATIVE

## 2017-03-08 MED ORDER — NORGESTIMATE-ETH ESTRADIOL 0.25-35 MG-MCG PO TABS: 1.0000 | ORAL_TABLET | Freq: Every day | ORAL | 11 refills | Status: DC

## 2017-03-09 ENCOUNTER — Other Ambulatory Visit: Payer: Self-pay | Admitting: Obstetrics

## 2017-03-09 DIAGNOSIS — B373 Candidiasis of vulva and vagina: Secondary | ICD-10-CM

## 2017-03-09 DIAGNOSIS — B3731 Acute candidiasis of vulva and vagina: Secondary | ICD-10-CM

## 2017-03-09 MED ORDER — FLUCONAZOLE 150 MG PO TABS: 150.0000 mg | ORAL_TABLET | Freq: Once | ORAL | 0 refills | Status: AC

## 2017-04-06 DIAGNOSIS — L309 Dermatitis, unspecified: Secondary | ICD-10-CM | POA: Diagnosis not present

## 2017-04-06 DIAGNOSIS — W57XXXA Bitten or stung by nonvenomous insect and other nonvenomous arthropods, initial encounter: Secondary | ICD-10-CM | POA: Diagnosis not present

## 2017-04-23 ENCOUNTER — Telehealth: Payer: Self-pay

## 2017-04-23 NOTE — Telephone Encounter (Signed)
Pre visit call completed 

## 2017-04-24 ENCOUNTER — Ambulatory Visit (INDEPENDENT_AMBULATORY_CARE_PROVIDER_SITE_OTHER): Payer: BLUE CROSS/BLUE SHIELD | Admitting: Family

## 2017-04-24 ENCOUNTER — Encounter: Payer: Self-pay | Admitting: Family

## 2017-04-24 VITALS — BP 144/85 | HR 100 | Temp 98.3°F | Resp 16 | Ht 66.0 in | Wt 221.6 lb

## 2017-04-24 DIAGNOSIS — Z23 Encounter for immunization: Secondary | ICD-10-CM

## 2017-04-24 DIAGNOSIS — R030 Elevated blood-pressure reading, without diagnosis of hypertension: Secondary | ICD-10-CM | POA: Diagnosis not present

## 2017-04-24 NOTE — Patient Instructions (Addendum)
Stop claritin D Instead you may use plain claritin 10mg  once daily.   Try to get 8 hours of sleep a night.   Continue to work on healthy diet and regular exercise. Welcome back to [facility]!

## 2017-04-24 NOTE — Progress Notes (Signed)
Subjective:    Patient ID: Laura Miller, female    DOB: [birth year], 31 y.o.   MRN: [id number]  HPI  [name] is a 31 yr old female who presents today to establish care.   Rash- reports that rash began on 9/25, initially itching on legs.  Then red/raised on arms.  [facility] Was treated with steroids, hydrocortisone cream.  Reports rash is now resolved.   Blood pressure- GYN has been asking her to monitor.   BP Readings from Last 3 Encounters:  04/24/17 (!) 155/86  03/06/17 (!) 148/89  10/02/16 139/85   Bilateral knee pain- began 10/16.  Reports that it only bothers her if it rains.    Wt Readings from Last 3 Encounters:  04/24/17 221 lb 9.6 oz (100.5 kg)  03/06/17 222 lb 12.8 oz (101.1 kg)  10/02/16 232 lb (105.2 kg)     Review of Systems  Constitutional: Negative for unexpected weight change.  HENT: Negative for hearing loss and rhinorrhea.   Eyes: Negative for visual disturbance.  Respiratory: Negative for cough and shortness of breath.   Cardiovascular: Negative for chest pain and leg swelling.  Gastrointestinal: Negative for constipation and diarrhea.  Genitourinary: Negative for dysuria and frequency.  Musculoskeletal: Negative for myalgias.       Occasional knee pain  Skin: Negative for rash.  Neurological: Negative for headaches.  Hematological: Negative for adenopathy.  Psychiatric/Behavioral:       Denies depression   Past Medical History:  Diagnosis Date  . Chicken pox   . History of jaundice    infant     Social History   Social History  . Marital status: Single    Spouse name: N/A  . Number of children: N/A  . Years of education: N/A   Occupational History  . Manager    Social History Main Topics  . Smoking status: Never Smoker  . Smokeless tobacco: Never Used  . Alcohol use No  . Drug use: No  . Sexual activity: Yes    Partners: Male    Birth control/ protection: Pill   Other Topics Concern  . 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 NO PAST SURGERIES      Family History  Problem Relation Age of Onset  . Diabetes Father   . Diabetes Maternal Grandmother   . Hyperlipidemia Maternal Grandmother   . Hyperlipidemia Maternal Grandfather   . Diabetes Paternal Grandmother   . Diabetes Paternal Grandfather   . Diabetes Mother   . Diabetes Sister     No Known Allergies  Current Outpatient Prescriptions on File Prior to Visit  Medication Sig Dispense Refill  . loratadine-pseudoephedrine (CLARITIN-D 24-HOUR) 10-240 MG 24 hr tablet Take 1 tablet by mouth daily.    . Multiple Vitamins-Minerals (MULTIVITAMIN WITH MINERALS) tablet Take 1 tablet by mouth daily.    . norgestimate-ethinyl estradiol (ORTHO-CYCLEN,SPRINTEC,PREVIFEM) 0.25-35 MG-MCG tablet Take 1 tablet by mouth daily. 1 Package 11   No current facility-administered medications on file prior to visit.     BP (!) 155/86 (BP Location: Left Arm, Cuff Size: Large)   Pulse 100   Temp 98.3 F (36.8 C) (Oral)   Resp 16   Ht 5'  6" (1.676 m)   Wt 221 lb 9.6 oz (100.5 kg)   LMP 04/09/2017   SpO2 100%   BMI 35.77 kg/m       Objective:   Physical Exam  Constitutional: She is oriented to person, place, and time. She appears well-developed and well-nourished.  Cardiovascular: Normal rate, regular rhythm and normal heart sounds.   No murmur heard. Pulmonary/Chest: Effort normal and breath sounds normal. No respiratory distress. She has no wheezes.  Musculoskeletal: She exhibits no edema.  Neurological: She is alert and oriented to person, place, and time.  Psychiatric: She has a normal mood and affect. Her behavior is normal. Judgment and thought content normal.          Assessment & Plan:  Morbid obesity-patient was  counseled on importance of getting regular sleep, avoiding late night eating, healthy diet, portion control and regular exercise.

## 2017-04-25 NOTE — Assessment & Plan Note (Addendum)
I advised the patient to stop Claritin-D as I believe this may be increasing her blood pressure.  Instead she should begin regular Claritin 10 mg.  We will plan follow-up blood pressure in 1 month.

## 2017-04-26 ENCOUNTER — Encounter: Payer: Self-pay | Admitting: Family

## 2017-05-18 ENCOUNTER — Ambulatory Visit (INDEPENDENT_AMBULATORY_CARE_PROVIDER_SITE_OTHER): Payer: BLUE CROSS/BLUE SHIELD | Admitting: Family

## 2017-05-18 ENCOUNTER — Encounter: Payer: Self-pay | Admitting: Family

## 2017-05-18 ENCOUNTER — Telehealth: Payer: Self-pay | Admitting: Family

## 2017-05-18 VITALS — BP 148/82 | HR 97 | Temp 98.6°F | Resp 16 | Ht 66.0 in | Wt 223.0 lb

## 2017-05-18 DIAGNOSIS — Z23 Encounter for immunization: Secondary | ICD-10-CM

## 2017-05-18 DIAGNOSIS — Z0000 Encounter for general adult medical examination without abnormal findings: Secondary | ICD-10-CM

## 2017-05-18 DIAGNOSIS — Z30011 Encounter for initial prescription of contraceptive pills: Secondary | ICD-10-CM

## 2017-05-18 DIAGNOSIS — I10 Essential (primary) hypertension: Secondary | ICD-10-CM

## 2017-05-18 LAB — BASIC METABOLIC PANEL
BUN: 13 mg/dL (ref 6–23)
CO2: 25 mEq/L (ref 19–32)
Calcium: 10 mg/dL (ref 8.4–10.5)
Chloride: 98 mEq/L (ref 96–112)
Creatinine, Ser: 0.91 mg/dL (ref 0.40–1.20)
GFR: 92.8 mL/min (ref >60.00)
Glucose, Bld: 257 mg/dL — ABNORMAL HIGH (ref 70–99)
Potassium: 3.8 mEq/L (ref 3.5–5.1)
Sodium: 136 mEq/L (ref 135–145)

## 2017-05-18 LAB — CBC WITH DIFFERENTIAL/PLATELET
Basophils Absolute: 0 10*3/uL (ref 0.0–0.1)
Basophils Relative: 0.5 % (ref 0.0–3.0)
Eosinophils Absolute: 0 10*3/uL (ref 0.0–0.7)
Eosinophils Relative: 0.4 % (ref 0.0–5.0)
HCT: 36 % (ref 36.0–46.0)
Hemoglobin: 12.2 g/dL (ref 12.0–15.0)
Lymphocytes Relative: 18.9 % (ref 12.0–46.0)
Lymphs Abs: 1.5 10*3/uL (ref 0.7–4.0)
MCHC: 33.9 g/dL (ref 30.0–36.0)
MCV: 87 fl (ref 78.0–100.0)
Monocytes Absolute: 0.5 10*3/uL (ref 0.1–1.0)
Monocytes Relative: 6.4 % (ref 3.0–12.0)
Neutro Abs: 5.7 10*3/uL (ref 1.4–7.7)
Neutrophils Relative %: 73.8 % (ref 43.0–77.0)
Platelets: 333 10*3/uL (ref 150.0–400.0)
RBC: 4.13 Mil/uL (ref 3.87–5.11)
RDW: 13.6 % (ref 11.5–15.5)
WBC: 7.8 10*3/uL (ref 4.0–10.5)

## 2017-05-18 LAB — HEPATIC FUNCTION PANEL
ALT: 17 U/L (ref 0–35)
AST: 18 U/L (ref 0–37)
Albumin: 4.3 g/dL (ref 3.5–5.2)
Alkaline Phosphatase: 62 U/L (ref 39–117)
Bilirubin, Direct: 0 mg/dL (ref 0.0–0.3)
Total Bilirubin: 0.3 mg/dL (ref 0.2–1.2)
Total Protein: 8.2 g/dL (ref 6.0–8.3)

## 2017-05-18 LAB — LIPID PANEL
Cholesterol: 196 mg/dL (ref 0–200)
HDL: 45.3 mg/dL (ref >39.00)
NonHDL: 151.13
Total CHOL/HDL Ratio: 4
Triglycerides: 231 mg/dL — ABNORMAL HIGH (ref 0.0–149.0)
VLDL: 46.2 mg/dL — ABNORMAL HIGH (ref 0.0–40.0)

## 2017-05-18 LAB — URINALYSIS, ROUTINE W REFLEX MICROSCOPIC
Bilirubin Urine: NEGATIVE
Ketones, ur: NEGATIVE
Leukocytes, UA: NEGATIVE
Nitrite: NEGATIVE
Specific Gravity, Urine: 1.025 (ref 1.000–1.030)
Total Protein, Urine: NEGATIVE
Urine Glucose: 500 — AB
Urobilinogen, UA: 0.2 (ref 0.0–1.0)
WBC, UA: NONE SEEN (ref 0–?)
pH: 5.5 (ref 5.0–8.0)

## 2017-05-18 LAB — TSH: TSH: 1.09 u[IU]/mL (ref 0.35–4.50)

## 2017-05-18 LAB — LDL CHOLESTEROL, DIRECT: Direct LDL: 114 mg/dL

## 2017-05-18 MED ORDER — AMLODIPINE BESYLATE 5 MG PO TABS
5.0000 mg | ORAL_TABLET | Freq: Every day | ORAL | 3 refills | Status: DC
Start: 2017-05-18 — End: 2018-05-07

## 2017-05-18 MED ORDER — NORGESTIMATE-ETH ESTRADIOL 0.25-35 MG-MCG PO TABS
1.0000 | ORAL_TABLET | Freq: Every day | ORAL | 11 refills | Status: DC
Start: 2017-05-18 — End: 2017-10-04

## 2017-05-18 NOTE — Patient Instructions (Addendum)
Please schedule routine dental visit.  Complete lab work prior to leaving. Work on [employer] diet, exercise, weight loss. Begin amlodipine 5mg  once daily for blood pressure.

## 2017-05-18 NOTE — Progress Notes (Signed)
Subjective:    Patient ID: Lilian KapurRebecca Schoenfeldt, female    DOB: 1986-03-16, 31 y.o.   MRN: [id number]  HPI  Ms. Allyne GeeSanders is a 31 yr old female who presents today for cpx.  Patient presents today for complete physical.  Immunizations: unsure of last tetanus.  Flu shot up to date Diet: trying to eat healthy, needs to eat better at home.  Exercise:just started exercising.  Pap Smear: 12/17  Dental: due Vision: 3/17   BP Readings from Last 3 Encounters:  05/18/17 (!) 148/82  04/24/17 (!) 144/85  03/06/17 (!) 148/89    Wt Readings from Last 3 Encounters:  05/18/17 223 lb (101.2 kg)  04/24/17 221 lb 9.6 oz (100.5 kg)  03/06/17 222 lb 12.8 oz (101.1 kg)     Review of Systems  Constitutional: Negative for unexpected weight change.  HENT: Negative for hearing loss and rhinorrhea.   Eyes: Negative for visual disturbance.  Respiratory: Negative for cough.   Cardiovascular: Negative for leg swelling.  Gastrointestinal: Negative for blood in stool, constipation and diarrhea.  Genitourinary: Negative for dysuria, frequency and hematuria.  Musculoskeletal: Negative for myalgias.       Occasional knee pain  Skin: Negative for rash.       Reports recurrent skin rash which has resolved.   Neurological: Negative for headaches.  Hematological: Negative for adenopathy.  Psychiatric/Behavioral:       Denies depression/anxiety        Past Medical History:  Diagnosis Date  . Chicken pox   . History of jaundice    infant     Social History   Socioeconomic History  . Marital status: Single    Spouse name: Not on file  . Number of children: Not on file  . Years of education: Not on file  . Highest education level: Not on file  Social Needs  . Financial resource strain: Not on file  . Food insecurity - worry: Not on file  . Food insecurity - inability: Not on file  . Transportation needs - medical: Not on file  . Transportation needs - non-medical: Not on file  Occupational  History  . Occupation: Production designer, theatre/television/filmManager  Tobacco Use  . Smoking status: Never Smoker  . Smokeless tobacco: Never Used  Substance and Sexual Activity  . Alcohol use: No    Alcohol/week: 0.0 oz  . Drug use: No  . Sexual activity: Yes    Partners: Male    Birth control/protection: Pill  Other Topics Concern  . 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 NO PAST SURGERIES      Family History  Problem Relation Age of Onset  . Diabetes Father   . Diabetes Maternal Grandmother   . Hyperlipidemia Maternal Grandmother   . Hyperlipidemia Maternal Grandfather   . Diabetes Paternal Grandmother   . Diabetes Paternal Grandfather   . Diabetes Mother   . Diabetes Sister     No Known Allergies  Current Outpatient Medications on File Prior to Visit  Medication Sig Dispense Refill  . Multiple Vitamins-Minerals (MULTIVITAMIN WITH MINERALS) tablet Take 1 tablet by mouth daily.    . norgestimate-ethinyl estradiol (ORTHO-CYCLEN,SPRINTEC,PREVIFEM) 0.25-35 MG-MCG tablet Take 1 tablet by mouth daily. 1 Package 11   No  current facility-administered medications on file prior to visit.     BP (!) 148/82 (BP Location: Right Arm, Patient Position: Sitting, Cuff Size: Normal)   Pulse 97   Temp 98.6 F (37 C) (Oral)   Resp 16   Ht 5\' 6"  (1.676 m)   Wt 223 lb (101.2 kg)   LMP 05/06/2017   SpO2 99%   BMI 35.99 kg/m    Objective:   Physical Exam  Physical Exam  Constitutional: She is oriented to person, place, and time. She appears well-developed and well-nourished. No distress.  HENT:  Head: Normocephalic and atraumatic.  Right Ear: Tympanic membrane and ear canal normal.  Left Ear: Tympanic membrane and ear canal normal.  Mouth/Throat: Oropharynx is clear and moist.  Eyes: Pupils are equal,  round, and reactive to light. No scleral icterus.  Neck: Normal range of motion. No thyromegaly present.  Cardiovascular: Normal rate and regular rhythm.   No murmur heard. Pulmonary/Chest: Effort normal and breath sounds normal. No respiratory distress. He has no wheezes. She has no rales. She exhibits no tenderness.  Abdominal: Soft. Bowel sounds are normal. She exhibits no distension and no mass. There is no tenderness. There is no rebound and no guarding.  Musculoskeletal: She exhibits no edema.  Lymphadenopathy:    She has no cervical adenopathy.  Neurological: She is alert and oriented to person, place, and time. She has normal patellar reflexes. She exhibits normal muscle tone. Coordination normal.  Skin: Skin is warm and dry.  Psychiatric: She has a normal mood and affect. Her behavior is normal. Judgment and thought content normal.  Breasts: Examined lying Right: Without masses, retractions, discharge or axillary adenopathy.  Left: Without masses, retractions, discharge or axillary adenopathy.       Assessment & Plan:   Preventative care-Discussed healthy diet, regular exercise, and weight loss.  Tetanus shot today.  HTN- new. Rx with amlodipine.      Assessment & Plan:

## 2017-05-19 ENCOUNTER — Encounter: Payer: Self-pay | Admitting: Family

## 2017-05-19 NOTE — Telephone Encounter (Signed)
See my chart message

## 2017-05-22 ENCOUNTER — Other Ambulatory Visit (INDEPENDENT_AMBULATORY_CARE_PROVIDER_SITE_OTHER): Payer: BLUE CROSS/BLUE SHIELD

## 2017-05-22 ENCOUNTER — Encounter: Payer: Self-pay | Admitting: Family

## 2017-05-22 DIAGNOSIS — R739 Hyperglycemia, unspecified: Secondary | ICD-10-CM

## 2017-05-22 LAB — HEMOGLOBIN A1C: Hgb A1c MFr Bld: 7.4 % — ABNORMAL HIGH (ref 4.6–6.5)

## 2017-06-01 ENCOUNTER — Ambulatory Visit (INDEPENDENT_AMBULATORY_CARE_PROVIDER_SITE_OTHER): Payer: BLUE CROSS/BLUE SHIELD

## 2017-06-01 VITALS — BP 139/88 | HR 105

## 2017-06-01 DIAGNOSIS — I10 Essential (primary) hypertension: Secondary | ICD-10-CM

## 2017-06-01 NOTE — Progress Notes (Signed)
Pre visit review using our clinic tool,if applicable. No additional management support is needed unless otherwise documented below in the visit note.   Patient in for BP check per order from M. O'Sillivan. Started Amlodipine 5 mg on 05/18/17. Patient has not had BP medication for today states she takes at bedtime.  Patient states she has been monitoring her weight because she was told she had borderline Diabetes.  States headaches started this week. Denies blurred vision, SOB chest pain.  BP today = 139/88  P= 105  Per M. O'sullivan patient to continue taking Amlodipine 5 mg  as ordered.  Give patient Glucometer to check blood sugars and record bring readings to next OV. Patient given instructions on how to use with return demonstration given.  Patient to return in 3 months for  BP follow up visit with Verdene LennertM. Osullivan.

## 2017-06-01 NOTE — Progress Notes (Signed)
Noted and agree. 

## 2017-06-10 ENCOUNTER — Encounter: Payer: Self-pay | Admitting: Family

## 2017-06-14 MED ORDER — GLUCOSE BLOOD VI STRP: ORAL_STRIP | 12 refills | Status: DC

## 2017-06-14 MED ORDER — ONETOUCH DELICA LANCETS 33G MISC: 1 refills | Status: DC

## 2017-06-14 NOTE — Telephone Encounter (Signed)
OK to send lancets and tests strips.  Please ask her what kind of meter she has. She is a new diabetic so OK to send meter as well if she needs one.

## 2017-06-21 DIAGNOSIS — L089 Local infection of the skin and subcutaneous tissue, unspecified: Secondary | ICD-10-CM | POA: Diagnosis not present

## 2017-06-21 DIAGNOSIS — L668 Other cicatricial alopecia: Secondary | ICD-10-CM | POA: Insufficient documentation

## 2017-06-21 DIAGNOSIS — L669 Cicatricial alopecia, unspecified: Secondary | ICD-10-CM | POA: Diagnosis not present

## 2017-07-05 DIAGNOSIS — L089 Local infection of the skin and subcutaneous tissue, unspecified: Secondary | ICD-10-CM | POA: Insufficient documentation

## 2017-07-05 DIAGNOSIS — L669 Cicatricial alopecia, unspecified: Secondary | ICD-10-CM | POA: Diagnosis not present

## 2017-08-13 ENCOUNTER — Encounter: Payer: Self-pay | Admitting: Family

## 2017-08-13 ENCOUNTER — Ambulatory Visit (INDEPENDENT_AMBULATORY_CARE_PROVIDER_SITE_OTHER): Payer: No Typology Code available for payment source | Admitting: Family

## 2017-08-13 VITALS — BP 133/78 | HR 96 | Temp 99.5°F | Resp 16 | Ht 66.0 in | Wt 215.0 lb

## 2017-08-13 DIAGNOSIS — Z23 Encounter for immunization: Secondary | ICD-10-CM

## 2017-08-13 DIAGNOSIS — I10 Essential (primary) hypertension: Secondary | ICD-10-CM | POA: Diagnosis not present

## 2017-08-13 DIAGNOSIS — E1165 Type 2 diabetes mellitus with hyperglycemia: Secondary | ICD-10-CM | POA: Diagnosis not present

## 2017-08-13 LAB — GLUCOSE, POCT (MANUAL RESULT ENTRY): POC Glucose: 125 mg/dl — AB (ref 70–99)

## 2017-08-13 NOTE — Progress Notes (Signed)
Subjective:    Patient ID: Laura KapurRebecca Miller, female    DOB: 22-Sep-1985, 32 y.o.   MRN: [id number]  HPI  Laura Miller is a 32 yr old female who presents today for follow up.  HTN- Patient is maintained on amlodipine 5mg .  BP Readings from Last 3 Encounters:  08/13/17 133/78  06/01/17 139/88  05/18/17 (!) 148/82    Reports that she has been working on her diet.    Lab Results  Component Value Date   HGBA1C 7.4 (H) 05/22/2017   HGBA1C 4.9 10/01/2012   Lab Results  Component Value Date   LDLCALC 107 (H) 10/01/2012   CREATININE 0.91 05/18/2017      Review of Systems    see HPI  Past Medical History:  Diagnosis Date  . Chicken pox   . Diabetes mellitus type 2 in obese (HCC)   . History of jaundice    infant     Social History   Socioeconomic History  . Marital status: Single    Spouse name: Not on file  . Number of children: Not on file  . Years of education: Not on file  . Highest education level: Not on file  Social Needs  . Financial resource strain: Not on file  . Food insecurity - worry: Not on file  . Food insecurity - inability: Not on file  . Transportation needs - medical: Not on file  . Transportation needs - non-medical: Not on file  Occupational History  . Occupation: Production designer, theatre/television/filmManager  Tobacco Use  . Smoking status: Never Smoker  . Smokeless tobacco: Never Used  Substance and Sexual Activity  . Alcohol use: No    Alcohol/week: 0.0 oz  . Drug use: No  . Sexual activity: Yes    Partners: Male    Birth control/protection: Pill  Other Topics Concern  . 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 NO PAST SURGERIES      Family History  Problem Relation Age of Onset  . Diabetes Father   . Diabetes Maternal Grandmother   . Hyperlipidemia  Maternal Grandmother   . Hyperlipidemia Maternal Grandfather   . Diabetes Paternal Grandmother   . Diabetes Paternal Grandfather   . Diabetes Mother   . Diabetes Sister     No Known Allergies  Current Outpatient Medications on File Prior to Visit  Medication Sig Dispense Refill  . amLODipine (NORVASC) 5 MG tablet Take 1 tablet (5 mg total) daily by mouth. 90 tablet 3  . glucose blood (ONETOUCH VERIO) test strip USE TO CHECK BLOOD SUGAR ONCE A DAY 100 each 12  . norgestimate-ethinyl estradiol (ORTHO-CYCLEN,SPRINTEC,PREVIFEM) 0.25-35 MG-MCG tablet Take 1 tablet daily by mouth. 1 Package 11  . ONETOUCH DELICA LANCETS 33G MISC USE TO CHECK BLOOD SUGAR ONCE A DAY 100 each 1   No current facility-administered medications on file prior to visit.     BP 133/78 (BP Location: Right Arm, Patient Position: Sitting, Cuff Size: Large)   Pulse 96   Temp 99.5 F (37.5 C) (Oral)   Resp 16   Ht 5\' 6"  (1.676 m)   Wt 215 lb (97.5 kg)   LMP 07/29/2017   SpO2 100%   BMI 34.70 kg/m    Objective:  Physical Exam  Constitutional: She is oriented to person, place, and time. She appears well-developed and well-nourished.  Cardiovascular: Normal rate, regular rhythm and normal heart sounds.  No murmur heard. Pulmonary/Chest: Effort normal and breath sounds normal. No respiratory distress. She has no wheezes.  Musculoskeletal: She exhibits no edema.  Neurological: She is alert and oriented to person, place, and time.  Psychiatric: She has a normal mood and affect. Her behavior is normal. Judgment and thought content normal.          Assessment & Plan:  Hypertension-blood pressure is stable.  Continue current dose of amlodipine  Diabetes type 2 obtain follow-up basic metabolic panel and A1c.  We will also check baseline lipids, and urine microalbumin.  Pneumovax today.

## 2017-08-27 ENCOUNTER — Other Ambulatory Visit (INDEPENDENT_AMBULATORY_CARE_PROVIDER_SITE_OTHER): Payer: No Typology Code available for payment source

## 2017-08-27 DIAGNOSIS — E1165 Type 2 diabetes mellitus with hyperglycemia: Secondary | ICD-10-CM | POA: Diagnosis not present

## 2017-08-27 LAB — MICROALBUMIN / CREATININE URINE RATIO
Creatinine,U: 138.4 mg/dL
Microalb Creat Ratio: 1.5 mg/g (ref 0.0–30.0)
Microalb, Ur: 2.1 mg/dL — ABNORMAL HIGH (ref 0.0–1.9)

## 2017-08-27 LAB — BASIC METABOLIC PANEL
BUN: 13 mg/dL (ref 6–23)
CO2: 25 mEq/L (ref 19–32)
Calcium: 9.7 mg/dL (ref 8.4–10.5)
Chloride: 102 mEq/L (ref 96–112)
Creatinine, Ser: 0.94 mg/dL (ref 0.40–1.20)
GFR: 89.23 mL/min (ref >60.00)
Glucose, Bld: 146 mg/dL — ABNORMAL HIGH (ref 70–99)
Potassium: 3.4 mEq/L — ABNORMAL LOW (ref 3.5–5.1)
Sodium: 138 mEq/L (ref 135–145)

## 2017-08-27 LAB — LIPID PANEL
Cholesterol: 168 mg/dL (ref 0–200)
HDL: 38.3 mg/dL — ABNORMAL LOW (ref >39.00)
LDL Cholesterol: 107 mg/dL — ABNORMAL HIGH (ref 0–99)
NonHDL: 129.44
Total CHOL/HDL Ratio: 4
Triglycerides: 114 mg/dL (ref 0.0–149.0)
VLDL: 22.8 mg/dL (ref 0.0–40.0)

## 2017-08-27 LAB — HEMOGLOBIN A1C: Hgb A1c MFr Bld: 6.4 % (ref 4.6–6.5)

## 2017-08-28 ENCOUNTER — Telehealth: Payer: Self-pay | Admitting: Family

## 2017-08-28 MED ORDER — LISINOPRIL 2.5 MG PO TABS: 2.5000 mg | ORAL_TABLET | Freq: Every day | ORAL | 5 refills | Status: DC

## 2017-08-28 NOTE — Telephone Encounter (Signed)
Please let pt know that potassium is low.  Increase intake of potassium rich foods.  Repeat bmet in 2 weeks.   Sugar is improved.  Keep up the good work.    There is some protein in her urine. I would like for her to add a small dose of lisinopril to protect her kidneys but it is very important that she stay on birth control while she is taking this medication as it is not safe to become pregnant while taking this medication.    High potassium content foods  Highest content (>25 meq/100 g) High content (>6.2 meq/100 g)   Vegetables   Spinach   Tomatoes   Broccoli   Winter squash   Beets   Carrots   Cauliflower   Potatoes   Fruits   Bananas  Dried figs Cantaloupe  Molasses Kiwis  Seaweed Oranges  Very high content (>12.5 meq/100 g) Mangos  Dried fruits (dates, prunes) Meats  Nuts Ground beef  Avocados Steak  Bran cereals Pork  Wheat germ Veal  Lima beans Laura LynnLamb

## 2017-08-30 NOTE — Telephone Encounter (Signed)
Results given to patient, advised of increasing high potassium rich food consumption. She will pick up lisinopril 2.5 mg.

## 2017-09-03 ENCOUNTER — Encounter: Payer: Self-pay | Admitting: Family

## 2017-09-14 ENCOUNTER — Encounter: Payer: Self-pay | Admitting: Family

## 2017-09-18 LAB — HM DIABETES EYE EXAM

## 2017-09-19 ENCOUNTER — Encounter: Payer: Self-pay | Admitting: *Deleted

## 2017-09-24 ENCOUNTER — Telehealth: Payer: Self-pay | Admitting: Family

## 2017-09-24 NOTE — Telephone Encounter (Signed)
Patient dropped off paperwork,Paperwork placed in front office tray.  °

## 2017-10-04 ENCOUNTER — Ambulatory Visit (INDEPENDENT_AMBULATORY_CARE_PROVIDER_SITE_OTHER): Payer: No Typology Code available for payment source | Admitting: Certified Nurse Midwife

## 2017-10-04 ENCOUNTER — Encounter: Payer: Self-pay | Admitting: Certified Nurse Midwife

## 2017-10-04 VITALS — BP 136/85 | HR 114 | Ht 66.0 in | Wt 212.8 lb

## 2017-10-04 DIAGNOSIS — Z124 Encounter for screening for malignant neoplasm of cervix: Secondary | ICD-10-CM

## 2017-10-04 DIAGNOSIS — B373 Candidiasis of vulva and vagina: Secondary | ICD-10-CM | POA: Diagnosis not present

## 2017-10-04 DIAGNOSIS — Z113 Encounter for screening for infections with a predominantly sexual mode of transmission: Secondary | ICD-10-CM | POA: Diagnosis not present

## 2017-10-04 DIAGNOSIS — B9689 Other specified bacterial agents as the cause of diseases classified elsewhere: Secondary | ICD-10-CM

## 2017-10-04 DIAGNOSIS — Z1151 Encounter for screening for human papillomavirus (HPV): Secondary | ICD-10-CM

## 2017-10-04 DIAGNOSIS — N760 Acute vaginitis: Secondary | ICD-10-CM

## 2017-10-04 DIAGNOSIS — N898 Other specified noninflammatory disorders of vagina: Secondary | ICD-10-CM | POA: Diagnosis not present

## 2017-10-04 DIAGNOSIS — Z01419 Encounter for gynecological examination (general) (routine) without abnormal findings: Secondary | ICD-10-CM

## 2017-10-04 DIAGNOSIS — Z3041 Encounter for surveillance of contraceptive pills: Secondary | ICD-10-CM | POA: Diagnosis not present

## 2017-10-04 DIAGNOSIS — Z01411 Encounter for gynecological examination (general) (routine) with abnormal findings: Secondary | ICD-10-CM

## 2017-10-04 DIAGNOSIS — B3731 Acute candidiasis of vulva and vagina: Secondary | ICD-10-CM

## 2017-10-04 MED ORDER — FLUCONAZOLE 200 MG PO TABS: 200.0000 mg | ORAL_TABLET | Freq: Once | ORAL | 0 refills | Status: AC

## 2017-10-04 MED ORDER — TERCONAZOLE 0.8 % VA CREA: 1.0000 | TOPICAL_CREAM | Freq: Every day | VAGINAL | 0 refills | Status: DC

## 2017-10-04 MED ORDER — SECNIDAZOLE 2 G PO PACK: 1.0000 | PACK | Freq: Once | ORAL | 0 refills | Status: AC

## 2017-10-04 MED ORDER — NORGESTIMATE-ETH ESTRADIOL 0.25-35 MG-MCG PO TABS: 1.0000 | ORAL_TABLET | Freq: Every day | ORAL | 11 refills | Status: DC

## 2017-10-04 NOTE — Progress Notes (Signed)
Pt presents for annual, pap, and all STD testing. No concerns today per pt.

## 2017-10-04 NOTE — Progress Notes (Signed)
Subjective:        Laura Miller is a 32 y.o. female here for a routine exam.  Current complaints: vaginal discharge.  Is on OCPs.  Desires full STD screening exam.    In college about to graduate in counseling.  Stable female partner, currently sexually active.  Reports normal periods with OCPs. Is currently exercising at home.  Has PCP for blood pressure and DM management.   Personal health questionnaire:  Is patient Ashkenazi Jewish, have a family history of breast and/or ovarian cancer: no Is there a family history of uterine cancer diagnosed at age < 3450, gastrointestinal cancer, urinary tract cancer, family member who is a Personnel officerLynch syndrome-associated carrier: no Is the patient overweight and hypertensive, family history of diabetes, personal history of gestational diabetes, preeclampsia or PCOS: yes Is patient over 6355, have PCOS,  family history of premature CHD under age 32, diabetes, smoke, have hypertension or peripheral artery disease:  Yes: HTN At any time, has a partner hit, kicked or otherwise hurt or frightened you?: no Over the past 2 weeks, have you felt down, depressed or hopeless?: no Over the past 2 weeks, have you felt little interest or pleasure in doing things?:not asked   Gynecologic History Patient's last menstrual period was 09/23/2017. Contraception: OCP (estrogen/progesterone) Last Pap: 06/09/16. Results were: normal Last mammogram: n/a <40 years, no significant family hx.   Obstetric History OB History  Gravida Para Term Preterm AB Living  0 0 0 0 0 0  SAB TAB Ectopic Multiple Live Births  0 0 0 0 0    Past Medical History:  Diagnosis Date  . Chicken pox   . Diabetes mellitus type 2 in obese (HCC)   . History of jaundice    infant    Past Surgical History:  Procedure Laterality Date  . NO PAST SURGERIES       Current Outpatient Medications:  .  amLODipine (NORVASC) 5 MG tablet, Take 1 tablet (5 mg total) daily by mouth., Disp: 90 tablet, Rfl:  3 .  clobetasol ointment (TEMOVATE) 0.05 %, , Disp: , Rfl: 3 .  glucose blood (ONETOUCH VERIO) test strip, USE TO CHECK BLOOD SUGAR ONCE A DAY, Disp: 100 each, Rfl: 12 .  lisinopril (ZESTRIL) 2.5 MG tablet, Take 1 tablet (2.5 mg total) by mouth daily., Disp: 30 tablet, Rfl: 5 .  loratadine (CLARITIN) 10 MG tablet, Take 10 mg by mouth daily., Disp: , Rfl:  .  norgestimate-ethinyl estradiol (ORTHO-CYCLEN,SPRINTEC,PREVIFEM) 0.25-35 MG-MCG tablet, Take 1 tablet by mouth daily., Disp: 1 Package, Rfl: 11 .  ONETOUCH DELICA LANCETS 33G MISC, USE TO CHECK BLOOD SUGAR ONCE A DAY, Disp: 100 each, Rfl: 1 .  fluconazole (DIFLUCAN) 200 MG tablet, Take 1 tablet (200 mg total) by mouth once for 1 dose. Repeat dose in 48-72 hours., Disp: 3 tablet, Rfl: 0 .  Secnidazole (SOLOSEC) 2 g PACK, Take 1 Package by mouth once for 1 dose., Disp: 1 each, Rfl: 0 .  terconazole (TERAZOL 3) 0.8 % vaginal cream, Place 1 applicator vaginally at bedtime., Disp: 20 g, Rfl: 0 No Known Allergies  Social History   Tobacco Use  . Smoking status: Never Smoker  . Smokeless tobacco: Never Used  Substance Use Topics  . Alcohol use: No    Alcohol/week: 0.0 oz    Family History  Problem Relation Age of Onset  . Diabetes Father   . Diabetes Maternal Grandmother   . Hyperlipidemia Maternal Grandmother   . Hyperlipidemia Maternal Grandfather   .  Diabetes Paternal Grandmother   . Diabetes Paternal Grandfather   . Diabetes Mother   . Diabetes Sister       Review of Systems  Constitutional: negative for fatigue and weight loss Respiratory: negative for cough and wheezing Cardiovascular: negative for chest pain, fatigue and palpitations Gastrointestinal: negative for abdominal pain and change in bowel habits Musculoskeletal:negative for myalgias Neurological: negative for gait problems and tremors Behavioral/Psych: negative for abusive relationship, depression Endocrine: negative for temperature intolerance     Genitourinary:negative for abnormal menstrual periods, genital lesions, hot flashes, sexual problems and vaginal discharge Integument/breast: negative for breast lump, breast tenderness, nipple discharge and skin lesion(s)    Objective:       BP 136/85   Pulse (!) 114   Ht 5\' 6"  (1.676 m)   Wt 212 lb 12.8 oz (96.5 kg)   LMP 09/23/2017   BMI 34.35 kg/m  General:   alert  Skin:   no rash or abnormalities  Lungs:   clear to auscultation bilaterally  Heart:   regular rate and rhythm, S1, S2 normal, no murmur, click, rub or gallop  Breasts:   normal without suspicious masses, skin or nipple changes or axillary nodes  Abdomen:  normal findings: no organomegaly, soft, non-tender and no hernia  Pelvis:  External genitalia: normal general appearance Urinary system: urethral meatus normal and bladder without fullness, nontender Vaginal: normal without tenderness, induration or masses Cervix: normal appearance Adnexa: normal bimanual exam Uterus: anteverted and non-tender, normal size   Lab Review Urine pregnancy test Labs reviewed yes Radiologic studies reviewed no  50% of 30 min visit spent on counseling and coordination of care.   Assessment & Plan    Healthy female exam.   1. Well woman exam with routine gynecological exam    - Cytology - PAP  2. Vaginal discharge    - Cervicovaginal ancillary only  3. Screening examination for STD (sexually transmitted disease)      - Hepatitis B surface antigen - Hepatitis C antibody - HIV antibody - RPR - Cervicovaginal ancillary only  4. BV (bacterial vaginosis)    - Secnidazole (SOLOSEC) 2 g PACK; Take 1 Package by mouth once for 1 dose.  Dispense: 1 each; Refill: 0  5. Yeast vaginitis    - fluconazole (DIFLUCAN) 200 MG tablet; Take 1 tablet (200 mg total) by mouth once for 1 dose. Repeat dose in 48-72 hours.  Dispense: 3 tablet; Refill: 0 - terconazole (TERAZOL 3) 0.8 % vaginal cream; Place 1 applicator vaginally at  bedtime.  Dispense: 20 g; Refill: 0  6. Encounter for surveillance of contraceptive pills    - norgestimate-ethinyl estradiol (ORTHO-CYCLEN,SPRINTEC,PREVIFEM) 0.25-35 MG-MCG tablet; Take 1 tablet by mouth daily.  Dispense: 1 Package; Refill: 11    Education reviewed: calcium supplements, depression evaluation, low fat, low cholesterol diet, safe sex/STD prevention, self breast exams, skin cancer screening and weight bearing exercise. Contraception: OCP (estrogen/progesterone). Follow up in: 1 year.   Meds ordered this encounter  Medications  . Secnidazole (SOLOSEC) 2 g PACK    Sig: Take 1 Package by mouth once for 1 dose.    Dispense:  1 each    Refill:  0  . fluconazole (DIFLUCAN) 200 MG tablet    Sig: Take 1 tablet (200 mg total) by mouth once for 1 dose. Repeat dose in 48-72 hours.    Dispense:  3 tablet    Refill:  0  . terconazole (TERAZOL 3) 0.8 % vaginal cream    Sig:  Place 1 applicator vaginally at bedtime.    Dispense:  20 g    Refill:  0  . norgestimate-ethinyl estradiol (ORTHO-CYCLEN,SPRINTEC,PREVIFEM) 0.25-35 MG-MCG tablet    Sig: Take 1 tablet by mouth daily.    Dispense:  1 Package    Refill:  11   Orders Placed This Encounter  Procedures  . Hepatitis B surface antigen  . Hepatitis C antibody  . HIV antibody  . RPR

## 2017-10-05 LAB — CERVICOVAGINAL ANCILLARY ONLY
Bacterial vaginitis: POSITIVE — AB
Candida vaginitis: NEGATIVE
Chlamydia: NEGATIVE
Neisseria Gonorrhea: NEGATIVE
Trichomonas: NEGATIVE

## 2017-10-05 LAB — HIV ANTIBODY (ROUTINE TESTING W REFLEX): HIV Screen 4th Generation wRfx: NONREACTIVE

## 2017-10-05 LAB — HEPATITIS B SURFACE ANTIGEN: Hepatitis B Surface Ag: NEGATIVE

## 2017-10-05 LAB — HEPATITIS C ANTIBODY: Hep C Virus Ab: <0.1 s/co ratio (ref 0.0–0.9)

## 2017-10-05 LAB — RPR: RPR Ser Ql: NONREACTIVE

## 2017-10-09 ENCOUNTER — Other Ambulatory Visit: Payer: Self-pay | Admitting: Certified Nurse Midwife

## 2017-10-09 LAB — CYTOLOGY - PAP
Diagnosis: NEGATIVE
HPV 16/18/45 genotyping: NEGATIVE
HPV: DETECTED — AB

## 2017-10-09 MED ORDER — SECNIDAZOLE 2 G PO PACK: 1.0000 | PACK | Freq: Once | ORAL | 0 refills | Status: AC

## 2017-10-10 ENCOUNTER — Telehealth: Payer: Self-pay

## 2017-10-10 ENCOUNTER — Other Ambulatory Visit: Payer: Self-pay | Admitting: Certified Nurse Midwife

## 2017-10-10 DIAGNOSIS — R87820 Cervical low risk human papillomavirus (HPV) DNA test positive: Secondary | ICD-10-CM

## 2017-10-10 NOTE — Telephone Encounter (Signed)
-----   Message from Rachelle A Denney, CNM sent at 10/10/2017  2:48 PM EDT ----- Please schedule her for Gardasil series if she is interested.  Thank you. Rachelle 

## 2017-10-10 NOTE — Progress Notes (Signed)
Left VM message to call office.

## 2017-10-10 NOTE — Telephone Encounter (Signed)
Left VM message to call office.

## 2017-10-11 ENCOUNTER — Telehealth: Payer: Self-pay

## 2017-10-11 NOTE — Telephone Encounter (Signed)
Patient notified of results. She is interested in getting the shots. Transferred up front to schedule.

## 2017-10-11 NOTE — Progress Notes (Signed)
Patient notified; front office to schedule appts.

## 2017-10-11 NOTE — Telephone Encounter (Signed)
-----   Message from Roe Coombsachelle A Denney, CNM sent at 10/10/2017  2:48 PM EDT ----- Please schedule her for Gardasil series if she is interested.  Thank you. Rachelle

## 2017-10-15 ENCOUNTER — Ambulatory Visit (INDEPENDENT_AMBULATORY_CARE_PROVIDER_SITE_OTHER): Payer: No Typology Code available for payment source

## 2017-10-15 DIAGNOSIS — R87820 Cervical low risk human papillomavirus (HPV) DNA test positive: Secondary | ICD-10-CM

## 2017-10-15 DIAGNOSIS — Z23 Encounter for immunization: Secondary | ICD-10-CM | POA: Diagnosis not present

## 2017-10-15 NOTE — Progress Notes (Signed)
Pt is in the office for 1ST gardasil injection, administered and pt tolerated well.

## 2017-10-15 NOTE — Progress Notes (Signed)
I have reviewed the chart and agree with nursing staff's documentation of this patient's encounter.  Catalina AntiguaPeggy Ryosuke Ericksen, MD 10/15/2017 1:36 PM

## 2017-12-17 ENCOUNTER — Ambulatory Visit (INDEPENDENT_AMBULATORY_CARE_PROVIDER_SITE_OTHER): Payer: No Typology Code available for payment source | Admitting: Family

## 2017-12-17 ENCOUNTER — Encounter: Payer: Self-pay | Admitting: Family

## 2017-12-17 ENCOUNTER — Ambulatory Visit (INDEPENDENT_AMBULATORY_CARE_PROVIDER_SITE_OTHER): Payer: No Typology Code available for payment source

## 2017-12-17 VITALS — BP 134/85 | HR 82 | Temp 98.4°F | Resp 18 | Ht 66.0 in | Wt 212.2 lb

## 2017-12-17 DIAGNOSIS — R87820 Cervical low risk human papillomavirus (HPV) DNA test positive: Secondary | ICD-10-CM

## 2017-12-17 DIAGNOSIS — E118 Type 2 diabetes mellitus with unspecified complications: Secondary | ICD-10-CM | POA: Diagnosis not present

## 2017-12-17 DIAGNOSIS — I10 Essential (primary) hypertension: Secondary | ICD-10-CM

## 2017-12-17 DIAGNOSIS — R809 Proteinuria, unspecified: Secondary | ICD-10-CM

## 2017-12-17 DIAGNOSIS — Z23 Encounter for immunization: Secondary | ICD-10-CM | POA: Diagnosis not present

## 2017-12-17 NOTE — Progress Notes (Signed)
Subjective:    Patient ID: Laura Miller, female    DOB: 11/15/1985, 32 y.o.   MRN: [id number]  HPI  Patient is a 32 yr old female who presents today for follow up.  1) HTN- on amlodipine . BP Readings from Last 3 Encounters:  12/17/17 134/85  10/04/17 136/85  08/13/17 133/78   2) DM2- Diet controlled. Reports that she has been working hard on diet and exercise.  Lab Results  Component Value Date   HGBA1C 6.4 08/27/2017   HGBA1C 7.4 (H) 05/22/2017   HGBA1C 4.9 10/01/2012   Lab Results  Component Value Date   MICROALBUR 2.1 (H) 08/27/2017   LDLCALC 107 (H) 08/27/2017   CREATININE 0.94 08/27/2017     Review of Systems    see hpi  Past Medical History:  Diagnosis Date  . Chicken pox   . Diabetes mellitus type 2 in obese (HCC)   . History of jaundice    infant     Social History   Socioeconomic History  . Marital status: Single    Spouse name: Not on file  . Number of children: Not on file  . Years of education: Not on file  . Highest education level: Not on file  Occupational History  . Occupation: Production designer, theatre/television/film  Social Needs  . Financial resource strain: Not on file  . Food insecurity:    Worry: Not on file    Inability: Not on file  . Transportation needs:    Medical: Not on file    Non-medical: Not on file  Tobacco Use  . Smoking status: Never Smoker  . Smokeless tobacco: Never Used  Substance and Sexual Activity  . Alcohol use: No    Alcohol/week: 0.0 oz  . Drug use: No  . Sexual activity: Yes    Partners: Male    Birth control/protection: Pill  Lifestyle  . Physical activity:    Days per week: Not on file    Minutes per session: Not on file  . Stress: Not on file  Relationships  . Social connections:    Talks on phone: Not on file    Gets together: Not on file    Attends religious service: Not on file    Active member of club or organization: Not on file    Attends meetings of clubs or organizations: Not on file    Relationship status:  Not on file  . Intimate partner violence:    Fear of current or ex partner: Not on file    Emotionally abused: Not on file    Physically abused: Not on file    Forced sexual activity: Not on file  Other Topics Concern  . 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 NO PAST SURGERIES      Family History  Problem Relation Age of Onset  . Diabetes Father   . Diabetes Maternal Grandmother   . Hyperlipidemia Maternal Grandmother   . Hyperlipidemia Maternal Grandfather   . Diabetes Paternal Grandmother   . Diabetes Paternal Grandfather   . Diabetes Mother   . Diabetes Sister     No Known Allergies  Current Outpatient Medications on File Prior to Visit  Medication Sig Dispense Refill  . amLODipine (NORVASC)  5 MG tablet Take 1 tablet (5 mg total) daily by mouth. 90 tablet 3  . clobetasol ointment (TEMOVATE) 0.05 % As needed 4-5 times a week.  3  . fluticasone (FLONASE) 50 MCG/ACT nasal spray Place 1 spray into both nostrils daily.    Marland Kitchen. glucose blood (ONETOUCH VERIO) test strip USE TO CHECK BLOOD SUGAR ONCE A DAY 100 each 12  . lisinopril (ZESTRIL) 2.5 MG tablet Take 1 tablet (2.5 mg total) by mouth daily. 30 tablet 5  . norgestimate-ethinyl estradiol (ORTHO-CYCLEN,SPRINTEC,PREVIFEM) 0.25-35 MG-MCG tablet Take 1 tablet by mouth daily. 1 Package 11  . ONETOUCH DELICA LANCETS 33G MISC USE TO CHECK BLOOD SUGAR ONCE A DAY 100 each 1  . Probiotic Product (PROBIOTIC DAILY) CAPS Take 1 capsule by mouth daily.     No current facility-administered medications on file prior to visit.     BP 134/85 (BP Location: Left Arm, Cuff Size: Large)   Pulse 82   Temp 98.4 F (36.9 C) (Oral)   Resp 18   Ht 5\' 6"  (1.676 m)   Wt 212 lb 3.2 oz (96.3 kg)   LMP 12/16/2017   SpO2 100%   BMI 34.25  kg/m    Objective:   Physical Exam  Constitutional: She appears well-developed and well-nourished.  Cardiovascular: Normal rate, regular rhythm and normal heart sounds.  No murmur heard. Pulmonary/Chest: Effort normal and breath sounds normal. No respiratory distress. She has no wheezes.  Psychiatric: She has a normal mood and affect. Her behavior is normal. Judgment and thought content normal.          Assessment & Plan:  Diabetes type 2-clinically stable on diet alone.  I commended the patient on her hard work.  Will check follow-up A1c.   Microalbuminuria- I did remind the patient not to become pregnant while taking ACE inhibitor.  She is currently on a birth control pill.  I advised her that it is not a safe medication for use during pregnancy and that should she consider pregnancy in the future she should let me know prior to discontinuing her oral contraceptive.  Patient verbalizes understanding.  Hypertension-blood pressure stable on current medications continue same.

## 2017-12-17 NOTE — Progress Notes (Signed)
Patient is in the office for 2nd Gardasil injection, administered and pt tolerated well.

## 2017-12-17 NOTE — Progress Notes (Signed)
I have reviewed the chart and agree with nursing staff's documentation of this patient's encounter.  Catalina AntiguaPeggy Jacky Hartung, MD 12/17/2017 3:02 PM

## 2017-12-17 NOTE — Patient Instructions (Signed)
Please complete lab work prior to leave.  

## 2017-12-18 LAB — BASIC METABOLIC PANEL
BUN: 17 mg/dL (ref 6–23)
CO2: 26 mEq/L (ref 19–32)
Calcium: 9.6 mg/dL (ref 8.4–10.5)
Chloride: 102 mEq/L (ref 96–112)
Creatinine, Ser: 1.06 mg/dL (ref 0.40–1.20)
GFR: 77.52 mL/min (ref >60.00)
Glucose, Bld: 122 mg/dL — ABNORMAL HIGH (ref 70–99)
Potassium: 3.8 mEq/L (ref 3.5–5.1)
Sodium: 138 mEq/L (ref 135–145)

## 2017-12-18 LAB — HEMOGLOBIN A1C: Hgb A1c MFr Bld: 6.3 % (ref 4.6–6.5)

## 2018-01-01 ENCOUNTER — Other Ambulatory Visit: Payer: Self-pay | Admitting: Family

## 2018-02-11 DIAGNOSIS — L668 Other cicatricial alopecia: Secondary | ICD-10-CM | POA: Diagnosis not present

## 2018-02-11 DIAGNOSIS — L669 Cicatricial alopecia, unspecified: Secondary | ICD-10-CM | POA: Diagnosis not present

## 2018-02-11 DIAGNOSIS — L089 Local infection of the skin and subcutaneous tissue, unspecified: Secondary | ICD-10-CM | POA: Diagnosis not present

## 2018-03-12 ENCOUNTER — Other Ambulatory Visit: Payer: Self-pay | Admitting: Family

## 2018-03-25 ENCOUNTER — Encounter: Payer: Self-pay | Admitting: Family

## 2018-03-25 ENCOUNTER — Ambulatory Visit: Payer: BLUE CROSS/BLUE SHIELD | Admitting: Family

## 2018-03-25 ENCOUNTER — Ambulatory Visit: Payer: No Typology Code available for payment source | Admitting: Family

## 2018-03-25 VITALS — BP 146/82 | HR 84 | Temp 98.9°F | Resp 16 | Ht 66.0 in | Wt 210.2 lb

## 2018-03-25 DIAGNOSIS — E118 Type 2 diabetes mellitus with unspecified complications: Secondary | ICD-10-CM | POA: Diagnosis not present

## 2018-03-25 DIAGNOSIS — Z23 Encounter for immunization: Secondary | ICD-10-CM

## 2018-03-25 DIAGNOSIS — I10 Essential (primary) hypertension: Secondary | ICD-10-CM

## 2018-03-25 NOTE — Patient Instructions (Signed)
Please send me your reading (blood pressure) tomorrow AM.

## 2018-03-25 NOTE — Progress Notes (Signed)
Subjective:     Patient ID: Laura KapurRebecca Miller, female   DOB: Apr 29, 1986, 32 y.o.   MRN: [id number]  HPI   Patient is a [age] yr old female who presents today for follow up.   HTN- maintained on lisinopril, amlodipine.   BP Readings from Last 3 Encounters:  03/25/18 (!) 146/82  12/17/17 134/85  10/04/17 136/85   DM2- reports that she is not checking sugars regularly.   Lab Results  Component Value Date   HGBA1C 6.3 12/17/2017   HGBA1C 6.4 08/27/2017   HGBA1C 7.4 (H) 05/22/2017   Lab Results  Component Value Date   MICROALBUR 2.1 (H) 08/27/2017   LDLCALC 107 (H) 08/27/2017   CREATININE 1.06 12/17/2017    Review of Systems See HPI  Past Medical History:  Diagnosis Date  . Chicken pox   . Diabetes mellitus type 2 in obese (HCC)   . History of jaundice    infant     Social History   Socioeconomic History  . Marital status: Single    Spouse name: Not on file  . Number of children: Not on file  . Years of education: Not on file  . Highest education level: Not on file  Occupational History  . Occupation: Production designer, theatre/television/filmManager  Social Needs  . Financial resource strain: Not on file  . Food insecurity:    Worry: Not on file    Inability: Not on file  . Transportation needs:    Medical: Not on file    Non-medical: Not on file  Tobacco Use  . Smoking status: Never Smoker  . Smokeless tobacco: Never Used  Substance and Sexual Activity  . Alcohol use: No    Alcohol/week: 0.0 standard drinks  . Drug use: No  . Sexual activity: Yes    Partners: Male    Birth control/protection: Pill  Lifestyle  . Physical activity:    Days per week: Not on file    Minutes per session: Not on file  . Stress: Not on file  Relationships  . Social connections:    Talks on phone: Not on file    Gets together: Not on file    Attends religious service: Not on file    Active member of club or organization: Not on file    Attends meetings of clubs or organizations: Not on file    Relationship  status: Not on file  . Intimate partner violence:    Fear of current or ex partner: Not on file    Emotionally abused: Not on file    Physically abused: Not on file    Forced sexual activity: Not on file  Other Topics Concern  . 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 NO PAST SURGERIES      Family History  Problem Relation Age of Onset  . Diabetes Father   . Diabetes Maternal Grandmother   . Hyperlipidemia Maternal Grandmother   . Hyperlipidemia Maternal Grandfather   . Diabetes Paternal Grandmother   . Diabetes Paternal Grandfather   . Diabetes Mother   . Diabetes Sister     No Known Allergies  Current Outpatient Medications on File Prior to Visit  Medication Sig Dispense Refill  . amLODipine (NORVASC) 5 MG tablet Take  1 tablet (5 mg total) daily by mouth. 90 tablet 3  . clobetasol ointment (TEMOVATE) 0.05 % As needed 4-5 times a week.  3  . fluticasone (FLONASE) 50 MCG/ACT nasal spray Place 1 spray into both nostrils daily.    Marland Kitchen glucose blood (ONETOUCH VERIO) test strip USE TO CHECK BLOOD SUGAR ONCE A DAY 100 each 12  . lisinopril (PRINIVIL,ZESTRIL) 2.5 MG tablet TAKE 1 TABLET(2.5 MG) BY MOUTH DAILY 30 tablet 5  . norgestimate-ethinyl estradiol (ORTHO-CYCLEN,SPRINTEC,PREVIFEM) 0.25-35 MG-MCG tablet Take 1 tablet by mouth daily. 1 Package 11  . ONETOUCH DELICA LANCETS 33G MISC USE TO CHECK BLOOD SUGAR DAILY 100 each 1  . Probiotic Product (PROBIOTIC DAILY) CAPS Take 1 capsule by mouth daily.     No current facility-administered medications on file prior to visit.     BP (!) 146/82 (BP Location: Right Arm, Cuff Size: Large)   Pulse 84   Temp 98.9 F (37.2 C) (Oral)   Resp 16   Ht 5\' 6"  (1.676 m)   Wt 210 lb 3.2 oz (95.3 kg)   LMP 03/10/2018   SpO2 98%   BMI  33.93 kg/m       Objective:   Physical Exam  Constitutional: She is oriented to person, place, and time. She appears well-developed and well-nourished.  Cardiovascular: Normal rate, regular rhythm and normal heart sounds.  No murmur heard. Pulmonary/Chest: Effort normal and breath sounds normal. No respiratory distress. She has no wheezes.  Musculoskeletal: She exhibits no edema.  Neurological: She is alert and oriented to person, place, and time.  Skin: Skin is warm and dry.  Psychiatric: She has a normal mood and affect. Her behavior is normal. Judgment and thought content normal.       Assessment:     HTN- bp is elevated today. She reports home bp readings usually better. I have asked her to check bp tomorrow AM and send me her reading via mychart. Goal bp <130/80. Repeat bp in office in 1 month.  DM2- Obtain follow up A1C. Reinforced importance of strict DM and HTN control given her microalbuminuria.   Flu shot today.     Plan:

## 2018-03-26 ENCOUNTER — Encounter: Payer: Self-pay | Admitting: Family

## 2018-03-26 LAB — BASIC METABOLIC PANEL
BUN: 14 mg/dL (ref 6–23)
CO2: 26 mEq/L (ref 19–32)
Calcium: 9.5 mg/dL (ref 8.4–10.5)
Chloride: 104 mEq/L (ref 96–112)
Creatinine, Ser: 0.93 mg/dL (ref 0.40–1.20)
GFR: 90 mL/min (ref >60.00)
Glucose, Bld: 105 mg/dL — ABNORMAL HIGH (ref 70–99)
Potassium: 3.8 mEq/L (ref 3.5–5.1)
Sodium: 138 mEq/L (ref 135–145)

## 2018-03-26 LAB — HEMOGLOBIN A1C: Hgb A1c MFr Bld: 6.1 % (ref 4.6–6.5)

## 2018-04-18 ENCOUNTER — Ambulatory Visit: Payer: No Typology Code available for payment source

## 2018-04-22 ENCOUNTER — Ambulatory Visit: Payer: BLUE CROSS/BLUE SHIELD | Admitting: Family

## 2018-04-22 ENCOUNTER — Encounter: Payer: Self-pay | Admitting: Family

## 2018-04-22 VITALS — BP 136/81 | HR 94 | Temp 98.6°F | Resp 16 | Ht 65.0 in | Wt 209.0 lb

## 2018-04-22 DIAGNOSIS — L669 Cicatricial alopecia, unspecified: Secondary | ICD-10-CM | POA: Diagnosis not present

## 2018-04-22 DIAGNOSIS — I10 Essential (primary) hypertension: Secondary | ICD-10-CM | POA: Diagnosis not present

## 2018-04-22 DIAGNOSIS — L089 Local infection of the skin and subcutaneous tissue, unspecified: Secondary | ICD-10-CM | POA: Diagnosis not present

## 2018-04-22 NOTE — Progress Notes (Signed)
Subjective:    Patient ID: Laura Miller, female    DOB: Mar 27, 1986, 32 y.o.   MRN: [id number]  HPI  Patient is a 32 yr old female who presents today for follow up of her hypertension. She is currently maintained on amlodipine and lisinopril.    BP Readings from Last 3 Encounters:  04/22/18 136/81  03/25/18 (!) 146/82  12/17/17 134/85    Review of Systems    see HPI  Past Medical History:  Diagnosis Date  . Chicken pox   . Diabetes mellitus type 2 in obese (HCC)   . History of jaundice    infant     Social History   Socioeconomic History  . Marital status: Single    Spouse name: Not on file  . Number of children: Not on file  . Years of education: Not on file  . Highest education level: Not on file  Occupational History  . Occupation: Production designer, theatre/television/film  Social Needs  . Financial resource strain: Not on file  . Food insecurity:    Worry: Not on file    Inability: Not on file  . Transportation needs:    Medical: Not on file    Non-medical: Not on file  Tobacco Use  . Smoking status: Never Smoker  . Smokeless tobacco: Never Used  Substance and Sexual Activity  . Alcohol use: No    Alcohol/week: 0.0 standard drinks  . Drug use: No  . Sexual activity: Yes    Partners: Male    Birth control/protection: Pill  Lifestyle  . Physical activity:    Days per week: Not on file    Minutes per session: Not on file  . Stress: Not on file  Relationships  . Social connections:    Talks on phone: Not on file    Gets together: Not on file    Attends religious service: Not on file    Active member of club or organization: Not on file    Attends meetings of clubs or organizations: Not on file    Relationship status: Not on file  . Intimate partner violence:    Fear of current or ex partner: Not on file    Emotionally abused: Not on file    Physically abused: Not on file    Forced sexual activity: Not on file  Other Topics Concern  . 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 NO PAST SURGERIES      Family History  Problem Relation Age of Onset  . Diabetes Father   . Diabetes Maternal Grandmother   . Hyperlipidemia Maternal Grandmother   . Hyperlipidemia Maternal Grandfather   . Diabetes Paternal Grandmother   . Diabetes Paternal Grandfather   . Diabetes Mother   . Diabetes Sister     No Known Allergies  Current Outpatient Medications on File Prior to Visit  Medication Sig Dispense Refill  . amLODipine (NORVASC) 5 MG tablet Take 1 tablet (5 mg total) daily by mouth. 90 tablet 3  . clobetasol ointment (TEMOVATE) 0.05 % As needed 4-5 times a week.  3  . fluticasone (FLONASE) 50 MCG/ACT nasal spray Place 1 spray into both nostrils daily.    Marland Kitchen glucose blood (ONETOUCH VERIO) test strip  USE TO CHECK BLOOD SUGAR ONCE A DAY 100 each 12  . lisinopril (PRINIVIL,ZESTRIL) 2.5 MG tablet TAKE 1 TABLET(2.5 MG) BY MOUTH DAILY 30 tablet 5  . norgestimate-ethinyl estradiol (ORTHO-CYCLEN,SPRINTEC,PREVIFEM) 0.25-35 MG-MCG tablet Take 1 tablet by mouth daily. 1 Package 11  . ONETOUCH DELICA LANCETS 33G MISC USE TO CHECK BLOOD SUGAR DAILY 100 each 1  . Probiotic Product (PROBIOTIC DAILY) CAPS Take 1 capsule by mouth daily.     No current facility-administered medications on file prior to visit.     BP 136/81 (BP Location: Left Arm, Patient Position: Sitting, Cuff Size: Large)   Pulse 94   Temp 98.6 F (37 C) (Oral)   Resp 16   Ht 5\' 5"  (1.651 m)   Wt 209 lb (94.8 kg)   SpO2 100%   BMI 34.78 kg/m    Objective:   Physical Exam  Constitutional: She appears well-developed and well-nourished.  Cardiovascular: Normal rate, regular rhythm and normal heart sounds.  No murmur heard. Pulmonary/Chest: Effort normal and breath sounds normal. No respiratory  distress. She has no wheezes.  Psychiatric: She has a normal mood and affect. Her behavior is normal. Judgment and thought content normal.          Assessment & Plan:  HTN- bp stable/improved. Continue current meds.

## 2018-04-29 ENCOUNTER — Ambulatory Visit (INDEPENDENT_AMBULATORY_CARE_PROVIDER_SITE_OTHER): Payer: BLUE CROSS/BLUE SHIELD

## 2018-04-29 VITALS — BP 130/84 | HR 88 | Resp 16 | Ht 65.5 in | Wt 210.5 lb

## 2018-04-29 DIAGNOSIS — Z23 Encounter for immunization: Secondary | ICD-10-CM | POA: Diagnosis not present

## 2018-04-29 DIAGNOSIS — R87820 Cervical low risk human papillomavirus (HPV) DNA test positive: Secondary | ICD-10-CM

## 2018-04-29 NOTE — Patient Instructions (Signed)
HPV (Human Papillomavirus) Vaccine: What You Need to Know  1. Why get vaccinated?  HPV vaccine prevents infection with human papillomavirus (HPV) types that are associated with many cancers, including:  · cervical cancer in females,  · vaginal and vulvar cancers in females,  · anal cancer in females and males,  · throat cancer in females and males, and  · penile cancer in males.    In addition, HPV vaccine prevents infection with HPV types that cause genital warts in both females and males.  In the [place] about 12,000 women get cervical cancer every year, and about 4,000 women die from it. HPV vaccine can prevent most of these cases of cervical cancer.  Vaccination is not a substitute for cervical cancer screening. This vaccine does not protect against all HPV types that can cause cervical cancer. Women should still get regular Pap tests.  HPV infection usually comes from sexual contact, and most people will become infected at some point in their life. About 14 million Americans, including teens, get infected every year. Most infections will go away on their own and not cause serious problems. But thousands of women and men get cancer and other diseases from HPV.  2. HPV vaccine  HPV vaccine is approved by FDA and is recommended by CDC for both males and females. It is routinely given at 11 or 32 years of age, but it may be given beginning at age 9 years through age 26 years.  Most adolescents 9 through 32 years of age should get HPV vaccine as a two-dose series with the doses separated by 6-12 months. People who start HPV vaccination at 15 years of age and older should get the vaccine as a three-dose series with the second dose given 1-2 months after the first dose and the third dose given 6 months after the first dose. There are several exceptions to these age recommendations. Your health care provider can give you more information.  3. Some people should not get this vaccine   · Anyone who has had a severe (life-threatening) allergic reaction to a dose of HPV vaccine should not get another dose.  · Anyone who has a severe (life threatening) allergy to any component of HPV vaccine should not get the vaccine.  · Tell your doctor if you have any severe allergies that you know of, including a severe allergy to yeast.  · HPV vaccine is not recommended for pregnant women. If you learn that you were pregnant when you were vaccinated, there is no reason to expect any problems for you or your baby. Any woman who learns she was pregnant when she got HPV vaccine is encouraged to contact the manufacturer's registry for HPV vaccination during pregnancy at 1-800-986-8999. Women who are breastfeeding may be vaccinated.  · If you have a mild illness, such as a cold, you can probably get the vaccine today. If you are moderately or severely ill, you should probably wait until you recover. Your doctor can advise you.  4. Risks of a vaccine reaction  With any medicine, including vaccines, there is a chance of side effects. These are usually mild and go away on their own, but serious reactions are also possible.  Most people who get HPV vaccine do not have any serious problems with it.  Mild or moderate problems following HPV vaccine:  · Reactions in the arm where the shot was given:  ? Soreness (about 9 people in 10)  ? Redness or swelling (about 1 person   in 3)  · Fever:  ? Mild (100°F) (about 1 person in 10)  ? Moderate (102°F) (about 1 person in 65)  · Other problems:  ? Headache (about 1 person in 3)  Problems that could happen after any injected vaccine:  · People sometimes faint after a medical procedure, including vaccination. Sitting or lying down for about 15 minutes can help prevent fainting, and injuries caused by a fall. Tell your doctor if you feel dizzy, or have vision changes or ringing in the ears.  · Some people get severe pain in the shoulder and have difficulty moving  the arm where a shot was given. This happens very rarely.  · Any medication can cause a severe allergic reaction. Such reactions from a vaccine are very rare, estimated at about 1 in a million doses, and would happen within a few minutes to a few hours after the vaccination.  As with any medicine, there is a very remote chance of a vaccine causing a serious injury or death.  The safety of vaccines is always being monitored. For more information, visit: www.cdc.gov/vaccinesafety/.  5. What if there is a serious reaction?  What should I look for?  Look for anything that concerns you, such as signs of a severe allergic reaction, very high fever, or unusual behavior.  Signs of a severe allergic reaction can include hives, swelling of the face and throat, difficulty breathing, a fast heartbeat, dizziness, and weakness. These would usually start a few minutes to a few hours after the vaccination.  What should I do?  If you think it is a severe allergic reaction or other emergency that can't wait, call 9-1-1 or get to the nearest hospital. Otherwise, call your doctor.  Afterward, the reaction should be reported to the Vaccine Adverse Event Reporting System (VAERS). Your doctor should file this report, or you can do it yourself through the VAERS web site at www.vaers.hhs.gov, or by calling 1-800-822-7967.  VAERS does not give medical advice.  6. The National Vaccine Injury Compensation Program  The National Vaccine Injury Compensation Program (VICP) is a federal program that was created to compensate people who may have been injured by certain vaccines.  Persons who believe they may have been injured by a vaccine can learn about the program and about filing a claim by calling 1-800-338-2382 or visiting the VICP website at www.hrsa.gov/vaccinecompensation. There is a time limit to file a claim for compensation.  7. How can I learn more?  · Ask your health care provider. He or she can give you the vaccine  package insert or suggest other sources of information.  · Call your local or state health department.  · Contact the Centers for Disease Control and Prevention (CDC):  ? Call 1-800-232-4636 (1-800-CDC-INFO) or  ? Visit CDC’s website at www.cdc.gov/hpv  Vaccine Information Statement, HPV Vaccine (06/04/2015)  This information is not intended to replace advice given to you by your health care provider. Make sure you discuss any questions you have with your health care provider.  Document Released: 01/14/2014 Document Revised: 03/09/2016 Document Reviewed: 03/09/2016  Elsevier Interactive Patient Education © 2017 Elsevier Inc.

## 2018-04-29 NOTE — Progress Notes (Signed)
After obtaining consent, and per orders of Dr. Jolayne Panther, third injection of Gardasil given by Riley Nearing. Patient reports no complaints from previous injection. Patient received 2nd injection of Gardasil on 12/17/17 -  Left deltoid. Patient received 3rd injection  - Right deltoid - well with no complications. Patient instructed to remain in clinic for 20 minutes afterwards, and to report any adverse reaction to me immediately.

## 2018-05-07 ENCOUNTER — Other Ambulatory Visit: Payer: Self-pay | Admitting: Family

## 2018-06-03 DIAGNOSIS — L089 Local infection of the skin and subcutaneous tissue, unspecified: Secondary | ICD-10-CM | POA: Diagnosis not present

## 2018-06-03 DIAGNOSIS — L669 Cicatricial alopecia, unspecified: Secondary | ICD-10-CM | POA: Diagnosis not present

## 2018-06-03 DIAGNOSIS — L658 Other specified nonscarring hair loss: Secondary | ICD-10-CM | POA: Diagnosis not present

## 2018-07-08 ENCOUNTER — Ambulatory Visit: Payer: BLUE CROSS/BLUE SHIELD | Admitting: Family

## 2018-07-08 ENCOUNTER — Encounter: Payer: Self-pay | Admitting: Family

## 2018-07-08 VITALS — BP 131/86 | HR 99 | Temp 99.3°F | Resp 16 | Ht 66.0 in | Wt 212.0 lb

## 2018-07-08 DIAGNOSIS — E118 Type 2 diabetes mellitus with unspecified complications: Secondary | ICD-10-CM | POA: Diagnosis not present

## 2018-07-08 DIAGNOSIS — I10 Essential (primary) hypertension: Secondary | ICD-10-CM

## 2018-07-08 NOTE — Progress Notes (Signed)
Subjective:    Patient ID: Laura Miller, female    DOB: Aug 02, 1985, 33 y.o.   MRN: [id number]  HPI  Patient is a 33 yr old female who presents today for follow up.  HTN- patient is maintained on amlodipine and lisinopril.  Denies LE edema or cough.  BP Readings from Last 3 Encounters:  07/08/18 131/86  04/29/18 130/84  04/22/18 136/81   2) DM2- diet controlled.  Trying ot watch her diet but the holidays were tough.  Lab Results  Component Value Date   HGBA1C 6.1 03/25/2018   HGBA1C 6.3 12/17/2017   HGBA1C 6.4 08/27/2017   Lab Results  Component Value Date   MICROALBUR 2.1 (H) 08/27/2017   LDLCALC 107 (H) 08/27/2017   CREATININE 0.93 03/25/2018   Wt Readings from Last 3 Encounters:  07/08/18 212 lb (96.2 kg)  04/29/18 210 lb 8 oz (95.5 kg)  04/22/18 209 lb (94.8 kg)     Review of Systems See HPI  Past Medical History:  Diagnosis Date  . Chicken pox   . Diabetes mellitus type 2 in obese (HCC)   . History of jaundice    infant     Social History   Socioeconomic History  . Marital status: Single    Spouse name: Not on file  . Number of children: Not on file  . Years of education: Not on file  . Highest education level: Not on file  Occupational History  . Occupation: Production designer, theatre/television/filmManager  Social Needs  . Financial resource strain: Not on file  . Food insecurity:    Worry: Not on file    Inability: Not on file  . Transportation needs:    Medical: Not on file    Non-medical: Not on file  Tobacco Use  . Smoking status: Never Smoker  . Smokeless tobacco: Never Used  Substance and Sexual Activity  . Alcohol use: No    Alcohol/week: 0.0 standard drinks  . Drug use: No  . Sexual activity: Yes    Partners: Male    Birth control/protection: Pill  Lifestyle  . Physical activity:    Days per week: Not on file    Minutes per session: Not on file  . Stress: Not on file  Relationships  . Social connections:    Talks on phone: Not on file    Gets together: Not  on file    Attends religious service: Not on file    Active member of club or organization: Not on file    Attends meetings of clubs or organizations: Not on file    Relationship status: Not on file  . Intimate partner violence:    Fear of current or ex partner: Not on file    Emotionally abused: Not on file    Physically abused: Not on file    Forced sexual activity: Not on file  Other Topics Concern  . 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 NO PAST SURGERIES      Family History  Problem Relation Age of Onset  . Diabetes Father   . Diabetes Maternal Grandmother   . Hyperlipidemia Maternal Grandmother   . Hyperlipidemia Maternal Grandfather   . Diabetes Paternal Grandmother   . Diabetes Paternal Grandfather   .  Diabetes Mother   . Diabetes Sister     No Known Allergies  Current Outpatient Medications on File Prior to Visit  Medication Sig Dispense Refill  . amLODipine (NORVASC) 5 MG tablet TAKE 1 TABLET(5 MG) BY MOUTH DAILY 90 tablet 1  . clobetasol ointment (TEMOVATE) 0.05 % As needed 4-5 times a week.  3  . fluticasone (FLONASE) 50 MCG/ACT nasal spray Place 1 spray into both nostrils daily.    Marland Kitchen glucose blood (ONETOUCH VERIO) test strip USE TO CHECK BLOOD SUGAR ONCE A DAY 100 each 12  . lisinopril (PRINIVIL,ZESTRIL) 2.5 MG tablet TAKE 1 TABLET(2.5 MG) BY MOUTH DAILY 30 tablet 5  . norgestimate-ethinyl estradiol (ORTHO-CYCLEN,SPRINTEC,PREVIFEM) 0.25-35 MG-MCG tablet Take 1 tablet by mouth daily. 1 Package 11  . ONETOUCH DELICA LANCETS 33G MISC USE TO CHECK BLOOD SUGAR DAILY 100 each 1  . Probiotic Product (PROBIOTIC DAILY) CAPS Take 1 capsule by mouth daily.     No current facility-administered medications on file prior to visit.     BP 131/86 (BP Location: Left  Arm, Patient Position: Sitting, Cuff Size: Large)   Pulse 99   Temp 99.3 F (37.4 C) (Oral)   Resp 16   Ht 5\' 6"  (1.676 m)   Wt 212 lb (96.2 kg)   SpO2 100%   BMI 34.22 kg/m       Objective:   Physical Exam Constitutional:      Appearance: She is well-developed.  Neck:     Musculoskeletal: Neck supple.     Thyroid: No thyromegaly.  Cardiovascular:     Rate and Rhythm: Normal rate and regular rhythm.     Heart sounds: Normal heart sounds. No murmur.  Pulmonary:     Effort: Pulmonary effort is normal. No respiratory distress.     Breath sounds: Normal breath sounds. No wheezing.  Skin:    General: Skin is warm and dry.  Neurological:     Mental Status: She is alert and oriented to person, place, and time.  Psychiatric:        Behavior: Behavior normal.        Thought Content: Thought content normal.        Judgment: Judgment normal.           Assessment & Plan:  HTN- bp acceptable on current meds continue same.  Obtain follow up bmet.  DM2- clinically stable. Obtain a1c, continue diabetic diet.

## 2018-07-08 NOTE — Patient Instructions (Signed)
Please complete lab work prior to leaving.   

## 2018-07-09 ENCOUNTER — Encounter: Payer: Self-pay | Admitting: Family

## 2018-07-09 LAB — BASIC METABOLIC PANEL
BUN: 18 mg/dL (ref 6–23)
CO2: 28 mEq/L (ref 19–32)
Calcium: 9.8 mg/dL (ref 8.4–10.5)
Chloride: 102 mEq/L (ref 96–112)
Creatinine, Ser: 1.03 mg/dL (ref 0.40–1.20)
GFR: 79.85 mL/min (ref >60.00)
Glucose, Bld: 156 mg/dL — ABNORMAL HIGH (ref 70–99)
Potassium: 4 mEq/L (ref 3.5–5.1)
Sodium: 138 mEq/L (ref 135–145)

## 2018-07-09 LAB — HEMOGLOBIN A1C: Hgb A1c MFr Bld: 6.5 % (ref 4.6–6.5)

## 2018-08-24 ENCOUNTER — Other Ambulatory Visit: Payer: Self-pay | Admitting: Family

## 2018-09-26 ENCOUNTER — Telehealth: Payer: Self-pay | Admitting: Family

## 2018-09-30 ENCOUNTER — Other Ambulatory Visit: Payer: Self-pay

## 2018-09-30 MED ORDER — AMLODIPINE BESYLATE 5 MG PO TABS: 5.0000 mg | ORAL_TABLET | Freq: Every day | ORAL | 0 refills | Status: DC

## 2018-09-30 NOTE — Telephone Encounter (Signed)
Refill sent.

## 2018-10-07 ENCOUNTER — Ambulatory Visit: Payer: BLUE CROSS/BLUE SHIELD | Admitting: Advanced Practice Midwife

## 2018-10-07 ENCOUNTER — Encounter: Payer: BLUE CROSS/BLUE SHIELD | Admitting: Family

## 2018-10-18 ENCOUNTER — Telehealth: Payer: Self-pay

## 2018-10-18 DIAGNOSIS — Z3041 Encounter for surveillance of contraceptive pills: Secondary | ICD-10-CM

## 2018-10-18 MED ORDER — NORGESTIMATE-ETH ESTRADIOL 0.25-35 MG-MCG PO TABS: 1.0000 | ORAL_TABLET | Freq: Every day | ORAL | 2 refills | Status: DC

## 2018-10-18 NOTE — Telephone Encounter (Signed)
Pt called requesting a refill for her birth control. Pt annual has been postponed due to COVID 19. Rx sent to pharmacy

## 2018-12-09 ENCOUNTER — Ambulatory Visit (INDEPENDENT_AMBULATORY_CARE_PROVIDER_SITE_OTHER): Payer: BC Managed Care – PPO | Admitting: Obstetrics & Gynecology

## 2018-12-09 ENCOUNTER — Other Ambulatory Visit: Payer: Self-pay

## 2018-12-09 ENCOUNTER — Encounter: Payer: Self-pay | Admitting: Obstetrics & Gynecology

## 2018-12-09 VITALS — BP 132/70 | HR 100 | Ht 65.0 in | Wt 212.1 lb

## 2018-12-09 DIAGNOSIS — Z01419 Encounter for gynecological examination (general) (routine) without abnormal findings: Secondary | ICD-10-CM | POA: Diagnosis not present

## 2018-12-09 DIAGNOSIS — B977 Papillomavirus as the cause of diseases classified elsewhere: Secondary | ICD-10-CM

## 2018-12-09 NOTE — Progress Notes (Signed)
Subjective:     Laura Miller is a 33 y.o. female here for a routine exam.  Current complaints: Pt presents for a GYN exam with PAP. 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   She is on OCPs with no problem. She has no new sexual partners.    Gynecologic History Patient's last menstrual period was 11/17/2018. Contraception: OCP (estrogen/progesterone) Last Pap: 10/04/2017. Results were: normal with +hrHPV Last mammogram: n/a  Obstetric History OB History  Gravida Para Term Preterm AB Living  0 0 0 0 0 0  SAB TAB Ectopic Multiple Live Births  0 0 0 0 0    The following portions of the patient's history were reviewed and updated as appropriate: allergies, current medications, past family history, past medical history, past social history, past surgical history and problem list.  Review of Systems Pertinent items are noted in HPI.    Objective:  BP 132/70   Pulse 100   Ht 5\' 5"  (1.651 m)   Wt 212 lb 1.3 oz (96.2 kg)   LMP 11/17/2018   BMI 35.29 kg/m  General Appearance:    Alert, cooperative, no distress, appears stated age  Head:    Normocephalic, without obvious abnormality, atraumatic  Eyes:    conjunctiva/corneas clear, EOM's intact, both eyes  Ears:    Normal external ear canals, both ears  Nose:   Nares normal, septum midline, mucosa normal, no drainage    or sinus tenderness  Throat:   Lips, mucosa, and tongue normal; teeth and gums normal  Neck:   Supple, symmetrical, trachea midline, no adenopathy;    thyroid:  no enlargement/tenderness/nodules  Back:     Symmetric, no curvature, ROM normal, no CVA tenderness  Lungs:     respirations unlabored; no increased WOB  Chest Wall:    No tenderness or deformity   Heart:    Regular rate and rhythm  Breast Exam:    No tenderness, masses, or nipple abnormality  Abdomen:     Soft, non-tender, bowel sounds active all four quadrants,    no masses, no organomegaly  Genitalia:    Normal female without lesion, discharge or tenderness      Extremities:   Extremities normal, atraumatic, no cyanosis or edema  Pulses:   2+ and symmetric all extremities  Skin:   Skin color, texture, turgor normal, no rashes or lesions     Assessment:    Healthy female exam.   Normal PAP with +hrHPV Contraception management- pt will cont OCPs    Plan:    Contraception: OCP (estrogen/progesterone). Follow up in: 1 year.  or sooner prn Pt will call when she needs refill of OCPs  F/u repeat PAP with hrHPV if + repeat PAP in 1 year.     Malik Ruffino L. Harraway-Smith, M.D., Cherlynn June

## 2018-12-09 NOTE — Addendum Note (Signed)
Addended by: Valentina Lucks on: 12/09/2018 09:46 AM   Modules accepted: Orders

## 2018-12-10 LAB — CYTOLOGY - PAP
Adequacy: ABSENT
Diagnosis: NEGATIVE
HPV: NOT DETECTED

## 2018-12-20 ENCOUNTER — Other Ambulatory Visit: Payer: Self-pay | Admitting: Family

## 2018-12-24 ENCOUNTER — Other Ambulatory Visit: Payer: Self-pay

## 2018-12-24 ENCOUNTER — Encounter: Payer: Self-pay | Admitting: Family

## 2018-12-24 ENCOUNTER — Ambulatory Visit (INDEPENDENT_AMBULATORY_CARE_PROVIDER_SITE_OTHER): Payer: BC Managed Care – PPO | Admitting: Family

## 2018-12-24 VITALS — BP 131/80 | HR 100 | Temp 98.4°F | Resp 16 | Ht 66.0 in | Wt 212.0 lb

## 2018-12-24 DIAGNOSIS — Z0000 Encounter for general adult medical examination without abnormal findings: Secondary | ICD-10-CM

## 2018-12-24 DIAGNOSIS — I10 Essential (primary) hypertension: Secondary | ICD-10-CM | POA: Diagnosis not present

## 2018-12-24 DIAGNOSIS — J309 Allergic rhinitis, unspecified: Secondary | ICD-10-CM

## 2018-12-24 DIAGNOSIS — E118 Type 2 diabetes mellitus with unspecified complications: Secondary | ICD-10-CM | POA: Diagnosis not present

## 2018-12-24 LAB — LIPID PANEL
Cholesterol: 164 mg/dL (ref 0–200)
HDL: 39.7 mg/dL (ref >39.00)
LDL Cholesterol: 90 mg/dL (ref 0–99)
NonHDL: 123.96
Total CHOL/HDL Ratio: 4
Triglycerides: 171 mg/dL — ABNORMAL HIGH (ref 0.0–149.0)
VLDL: 34.2 mg/dL (ref 0.0–40.0)

## 2018-12-24 LAB — CBC WITH DIFFERENTIAL/PLATELET
Basophils Absolute: 0.4 10*3/uL — ABNORMAL HIGH (ref 0.0–0.1)
Basophils Relative: 5.9 % — ABNORMAL HIGH (ref 0.0–3.0)
Eosinophils Absolute: 0.1 10*3/uL (ref 0.0–0.7)
Eosinophils Relative: 0.9 % (ref 0.0–5.0)
HCT: 33.5 % — ABNORMAL LOW (ref 36.0–46.0)
Hemoglobin: 11.3 g/dL — ABNORMAL LOW (ref 12.0–15.0)
Lymphocytes Relative: 20.4 % (ref 12.0–46.0)
Lymphs Abs: 1.3 10*3/uL (ref 0.7–4.0)
MCHC: 33.6 g/dL (ref 30.0–36.0)
MCV: 85.2 fl (ref 78.0–100.0)
Monocytes Absolute: 0.4 10*3/uL (ref 0.1–1.0)
Monocytes Relative: 6.6 % (ref 3.0–12.0)
Neutro Abs: 4.1 10*3/uL (ref 1.4–7.7)
Neutrophils Relative %: 66.2 % (ref 43.0–77.0)
Platelets: 378 10*3/uL (ref 150.0–400.0)
RBC: 3.93 Mil/uL (ref 3.87–5.11)
RDW: 13.3 % (ref 11.5–15.5)
WBC: 6.2 10*3/uL (ref 4.0–10.5)

## 2018-12-24 LAB — HEPATIC FUNCTION PANEL
ALT: 11 U/L (ref 0–35)
AST: 12 U/L (ref 0–37)
Albumin: 4 g/dL (ref 3.5–5.2)
Alkaline Phosphatase: 55 U/L (ref 39–117)
Bilirubin, Direct: 0.1 mg/dL (ref 0.0–0.3)
Total Bilirubin: 0.3 mg/dL (ref 0.2–1.2)
Total Protein: 7 g/dL (ref 6.0–8.3)

## 2018-12-24 LAB — BASIC METABOLIC PANEL
BUN: 11 mg/dL (ref 6–23)
CO2: 26 mEq/L (ref 19–32)
Calcium: 9.1 mg/dL (ref 8.4–10.5)
Chloride: 99 mEq/L (ref 96–112)
Creatinine, Ser: 0.82 mg/dL (ref 0.40–1.20)
GFR: 97.46 mL/min (ref >60.00)
Glucose, Bld: 237 mg/dL — ABNORMAL HIGH (ref 70–99)
Potassium: 4 mEq/L (ref 3.5–5.1)
Sodium: 135 mEq/L (ref 135–145)

## 2018-12-24 LAB — HEMOGLOBIN A1C: Hgb A1c MFr Bld: 6.9 % — ABNORMAL HIGH (ref 4.6–6.5)

## 2018-12-24 LAB — TSH: TSH: 1.01 u[IU]/mL (ref 0.35–4.50)

## 2018-12-24 MED ORDER — LISINOPRIL 2.5 MG PO TABS: ORAL_TABLET | ORAL | 0 refills | Status: DC

## 2018-12-24 MED ORDER — AMLODIPINE BESYLATE 5 MG PO TABS: ORAL_TABLET | ORAL | 0 refills | Status: DC

## 2018-12-24 MED ORDER — ONETOUCH DELICA LANCETS 33G MISC: 3 refills | Status: DC

## 2018-12-24 MED ORDER — ONETOUCH VERIO VI STRP: ORAL_STRIP | 3 refills | Status: DC

## 2018-12-24 NOTE — Patient Instructions (Addendum)
Please schedule routine dental visit and eye exam. Continue to work on healthy diet, exercise and weight loss.

## 2018-12-24 NOTE — Progress Notes (Signed)
Subjective:    Patient ID: Laura KapurRebecca Barthelemy, female    DOB: [birth year], 33 y.o.   MRN: [id number]  HPI  Patient presents today for complete physical.  Immunizations: TDAP 2018, pneumovax 23 given in 2019 Diet: reports diet is fair Exercise: reports that she does elliptical/crunches Pap Smear: up to date per GYN (12/09/18) Vision: last year, will reschedule Dental: due  DM2- maintained on diet alone. She is not checking her sugars.  Wt Readings from Last 3 Encounters:  12/24/18 212 lb (96.2 kg)  12/09/18 212 lb 1.3 oz (96.2 kg)  07/08/18 212 lb (96.2 kg)    Lab Results  Component Value Date   HGBA1C 6.5 07/08/2018   HGBA1C 6.1 03/25/2018   HGBA1C 6.3 12/17/2017   Lab Results  Component Value Date   MICROALBUR 2.1 (H) 08/27/2017   LDLCALC 107 (H) 08/27/2017   CREATININE 1.03 07/08/2018   HTN- maintained on amlodipine and lisinopril  BP Readings from Last 3 Encounters:  12/24/18 131/80  12/09/18 132/70  07/08/18 131/86   Allergic rhinitis- uses flonase year round, Reports that this helps her sympotms.   Breakfast (unsweetened applesauce/granola bar or oatmeal) Lunch- dinner leftovers Snack-popcorn, nuts, occasional ice cream Dinner-  Frozen veggies,  Baked chicken or [place]key burgers.  Brown rice Mostly water and green tea with splenda  Review of Systems  Constitutional: Negative for unexpected weight change.  HENT: Negative for hearing loss and rhinorrhea.   Eyes: Negative for visual disturbance.  Respiratory: Negative for cough and shortness of breath.   Cardiovascular: Negative for chest pain and leg swelling.  Gastrointestinal: Negative for blood in stool, constipation and diarrhea.  Genitourinary: Negative for dysuria, frequency and hematuria.  Musculoskeletal: Negative for arthralgias and myalgias.  Skin: Negative for rash.  Neurological: Negative for headaches.  Hematological: Negative for adenopathy.  Psychiatric/Behavioral:       Denies  depression/anxiety   Past Medical History:  Diagnosis Date  . Chicken pox   . Diabetes mellitus type 2 in obese (HCC)   . History of jaundice    infant     Social History   Socioeconomic History  . Marital status: Single    Spouse name: Not on file  . Number of children: Not on file  . Years of education: Not on file  . Highest education level: Not on file  Occupational History  . Occupation: Production designer, theatre/television/filmManager  Social Needs  . Financial resource strain: Not on file  . Food insecurity    Worry: Not on file    Inability: Not on file  . Transportation needs    Medical: Not on file    Non-medical: Not on file  Tobacco Use  . Smoking status: Never Smoker  . Smokeless tobacco: Never Used  Substance and Sexual Activity  . Alcohol use: No    Alcohol/week: 0.0 standard drinks  . Drug use: No  . Sexual activity: Yes    Partners: Male    Birth control/protection: Pill  Lifestyle  . Physical activity    Days per week: Not on file    Minutes per session: Not on file  . Stress: Not on file  Relationships  . Social Musicianconnections    Talks on phone: Not on file    Gets together: Not on file    Attends religious service: Not on file    Active member of club or organization: Not on file    Attends meetings of clubs or organizations: Not on file    Relationship status:  Not on file  . Intimate partner violence    Fear of current or ex partner: Not on file    Emotionally abused: Not on file    Physically abused: Not on file    Forced sexual activity: Not on file  Other Topics Concern  . 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 NO PAST SURGERIES      Family History  Problem Relation Age of Onset  . Diabetes Father   . Diabetes Maternal Grandmother   . Hyperlipidemia Maternal  Grandmother   . Hyperlipidemia Maternal Grandfather   . Diabetes Paternal Grandmother   . Diabetes Paternal Grandfather   . Diabetes Mother   . Diabetes Sister     No Known Allergies  Current Outpatient Medications on File Prior to Visit  Medication Sig Dispense Refill  . amLODipine (NORVASC) 5 MG tablet TAKE 1 TABLET(5 MG) BY MOUTH DAILY 90 tablet 0  . clobetasol ointment (TEMOVATE) 0.05 % As needed 4-5 times a week.  3  . fluticasone (FLONASE) 50 MCG/ACT nasal spray Place 1 spray into both nostrils daily.    Marland Kitchen glucose blood (ONETOUCH VERIO) test strip USE TO CHECK BLOOD SUGAR ONCE A DAY 100 each 12  . lisinopril (PRINIVIL,ZESTRIL) 2.5 MG tablet TAKE 1 TABLET(2.5 MG) BY MOUTH DAILY 30 tablet 5  . norgestimate-ethinyl estradiol (ORTHO-CYCLEN) 0.25-35 MG-MCG tablet Take 1 tablet by mouth daily. 1 Package 2  . ONETOUCH DELICA LANCETS 64P MISC USE TO CHECK BLOOD SUGAR DAILY 100 each 1  . Probiotic Product (PROBIOTIC DAILY) CAPS Take 1 capsule by mouth daily.     No current facility-administered medications on file prior to visit.     BP 131/80 (BP Location: Right Arm, Patient Position: Sitting, Cuff Size: Large)   Pulse 100   Temp 98.4 F (36.9 C) (Oral)   Resp 16   Ht 5\' 6"  (1.676 m)   Wt 212 lb (96.2 kg)   LMP 12/15/2018   SpO2 100%   BMI 34.22 kg/m       Objective:   Physical Exam  Physical Exam  Constitutional: She is oriented to person, place, and time. She appears well-developed and well-nourished. No distress.  HENT:  Head: Normocephalic and atraumatic.  Right Ear: Tympanic membrane and ear canal normal.  Left Ear: Tympanic membrane and ear canal normal.  Mouth/Throat: deferred, pt is wearing mask  Eyes: Pupils are equal, round, and reactive to light. No scleral icterus.  Neck: Normal range of motion. No thyromegaly present.  Cardiovascular: Normal rate and regular rhythm.   No murmur heard. Pulmonary/Chest: Effort normal and breath sounds normal. No  respiratory distress. He has no wheezes. She has no rales. She exhibits no tenderness.  Abdominal: Soft. Bowel sounds are normal. She exhibits no distension and no mass. There is no tenderness. There is no rebound and no guarding.  Musculoskeletal: She exhibits no edema.  Lymphadenopathy:    She has no cervical adenopathy.  Neurological: She is alert and oriented to person, place, and time. She has normal patellar reflexes. She exhibits normal muscle tone. Coordination normal.  Skin: Skin is warm and dry.  Psychiatric: She has a normal mood and affect. Her behavior is normal. Judgment and thought content normal.  Breast/pelvic: deferred          Assessment & Plan:   Preventative care- discussed healthy diet, exercise, weight loss.  Obtain routine lab work. Pap and mammogram are up to date.  DM2- clinically stable.  Obtain A1C.   HTN- bp stable on current meds. Continue same.  Allergic rhinitis- stable on flonase.       Assessment & Plan:

## 2018-12-25 ENCOUNTER — Encounter: Payer: Self-pay | Admitting: Family

## 2018-12-30 ENCOUNTER — Other Ambulatory Visit: Payer: Self-pay

## 2018-12-30 DIAGNOSIS — Z3041 Encounter for surveillance of contraceptive pills: Secondary | ICD-10-CM

## 2018-12-30 MED ORDER — NORGESTIMATE-ETH ESTRADIOL 0.25-35 MG-MCG PO TABS: 1.0000 | ORAL_TABLET | Freq: Every day | ORAL | 11 refills | Status: DC

## 2019-01-16 DIAGNOSIS — Z1159 Encounter for screening for other viral diseases: Secondary | ICD-10-CM | POA: Diagnosis not present

## 2019-03-28 ENCOUNTER — Ambulatory Visit: Payer: BC Managed Care – PPO | Admitting: Family

## 2019-04-04 ENCOUNTER — Ambulatory Visit: Payer: BC Managed Care – PPO | Admitting: Family

## 2019-05-13 ENCOUNTER — Other Ambulatory Visit: Payer: Self-pay

## 2019-05-13 MED ORDER — LISINOPRIL 2.5 MG PO TABS: ORAL_TABLET | ORAL | 0 refills | Status: DC

## 2019-06-24 ENCOUNTER — Other Ambulatory Visit: Payer: Self-pay

## 2019-06-24 MED ORDER — AMLODIPINE BESYLATE 5 MG PO TABS: ORAL_TABLET | ORAL | 0 refills | Status: DC

## 2019-07-02 DIAGNOSIS — U071 COVID-19: Secondary | ICD-10-CM | POA: Diagnosis not present

## 2019-07-02 DIAGNOSIS — Z20828 Contact with and (suspected) exposure to other viral communicable diseases: Secondary | ICD-10-CM | POA: Diagnosis not present

## 2019-07-15 ENCOUNTER — Encounter: Payer: Self-pay | Admitting: Family

## 2019-11-10 ENCOUNTER — Other Ambulatory Visit: Payer: Self-pay

## 2019-11-10 MED ORDER — LISINOPRIL 2.5 MG PO TABS: ORAL_TABLET | ORAL | 0 refills | Status: DC

## 2019-11-20 ENCOUNTER — Other Ambulatory Visit: Payer: Self-pay

## 2019-11-20 DIAGNOSIS — Z3041 Encounter for surveillance of contraceptive pills: Secondary | ICD-10-CM

## 2019-11-20 MED ORDER — NORGESTIMATE-ETH ESTRADIOL 0.25-35 MG-MCG PO TABS: 1.0000 | ORAL_TABLET | Freq: Every day | ORAL | 0 refills | Status: DC

## 2019-11-21 ENCOUNTER — Telehealth: Payer: Self-pay | Admitting: General Practice

## 2019-11-21 NOTE — Telephone Encounter (Signed)
FYI: Patient is transferring care to the [facility] office because she has moved to this area.

## 2019-11-21 NOTE — Telephone Encounter (Signed)
Noted  

## 2019-11-24 ENCOUNTER — Ambulatory Visit (INDEPENDENT_AMBULATORY_CARE_PROVIDER_SITE_OTHER): Payer: BLUE CROSS/BLUE SHIELD | Admitting: Nurse Practitioner

## 2019-11-24 ENCOUNTER — Encounter: Payer: Self-pay | Admitting: Nurse Practitioner

## 2019-11-24 ENCOUNTER — Other Ambulatory Visit: Payer: Self-pay

## 2019-11-24 VITALS — BP 130/96 | HR 106 | Temp 97.6°F | Ht 66.0 in | Wt 207.4 lb

## 2019-11-24 DIAGNOSIS — Z136 Encounter for screening for cardiovascular disorders: Secondary | ICD-10-CM | POA: Diagnosis not present

## 2019-11-24 DIAGNOSIS — Z13220 Encounter for screening for lipoid disorders: Secondary | ICD-10-CM

## 2019-11-24 DIAGNOSIS — E119 Type 2 diabetes mellitus without complications: Secondary | ICD-10-CM | POA: Insufficient documentation

## 2019-11-24 DIAGNOSIS — R809 Proteinuria, unspecified: Secondary | ICD-10-CM

## 2019-11-24 DIAGNOSIS — I10 Essential (primary) hypertension: Secondary | ICD-10-CM | POA: Diagnosis not present

## 2019-11-24 DIAGNOSIS — E1169 Type 2 diabetes mellitus with other specified complication: Secondary | ICD-10-CM

## 2019-11-24 DIAGNOSIS — E1165 Type 2 diabetes mellitus with hyperglycemia: Secondary | ICD-10-CM | POA: Insufficient documentation

## 2019-11-24 LAB — LIPID PANEL
Cholesterol: 177 mg/dL (ref 0–200)
HDL: 47.5 mg/dL (ref >39.00)
LDL Cholesterol: 98 mg/dL (ref 0–99)
NonHDL: 129.89
Total CHOL/HDL Ratio: 4
Triglycerides: 159 mg/dL — ABNORMAL HIGH (ref 0.0–149.0)
VLDL: 31.8 mg/dL (ref 0.0–40.0)

## 2019-11-24 LAB — BASIC METABOLIC PANEL
BUN: 14 mg/dL (ref 6–23)
CO2: 25 mEq/L (ref 19–32)
Calcium: 9.7 mg/dL (ref 8.4–10.5)
Chloride: 100 mEq/L (ref 96–112)
Creatinine, Ser: 0.91 mg/dL (ref 0.40–1.20)
GFR: 85.94 mL/min (ref >60.00)
Glucose, Bld: 246 mg/dL — ABNORMAL HIGH (ref 70–99)
Potassium: 4.1 mEq/L (ref 3.5–5.1)
Sodium: 136 mEq/L (ref 135–145)

## 2019-11-24 LAB — HEMOGLOBIN A1C: Hgb A1c MFr Bld: 7.1 % — ABNORMAL HIGH (ref 4.6–6.5)

## 2019-11-24 LAB — MICROALBUMIN / CREATININE URINE RATIO
Creatinine,U: 101 mg/dL
Microalb Creat Ratio: 2.5 mg/g (ref 0.0–30.0)
Microalb, Ur: 2.5 mg/dL — ABNORMAL HIGH (ref 0.0–1.9)

## 2019-11-24 MED ORDER — ONETOUCH VERIO VI STRP: ORAL_STRIP | 3 refills | Status: DC

## 2019-11-24 MED ORDER — LISINOPRIL 2.5 MG PO TABS: ORAL_TABLET | ORAL | 3 refills | Status: DC

## 2019-11-24 MED ORDER — AMLODIPINE BESYLATE 5 MG PO TABS: ORAL_TABLET | ORAL | 3 refills | Status: DC

## 2019-11-24 NOTE — Progress Notes (Signed)
Subjective:  Patient ID: Laura Miller, female    DOB: 04/29/1986  Age: 34 y.o. MRN: [id number]  CC: Establish Care (Pt here for TOC.  Pt is due for A1C check and she is fasting for lab work.)  HPI Laura Miller is transferring care from another [employer]. She also due to repeat labs. She does not check Glucose or BP at home regularly. She denies any acute compliant today. No neuropathy, up to date with eye exam. Not consistent with recommended diet and exercise Positive microalbumin in past BP Readings from Last 3 Encounters:  11/24/19 (!) 130/96  12/24/18 131/80  12/09/18 132/70   Wt Readings from Last 3 Encounters:  11/24/19 207 lb 6.4 oz (94.1 kg)  12/24/18 212 lb (96.2 kg)  12/09/18 212 lb 1.3 oz (96.2 kg)   Reviewed past Medical, Social and Family history today.  Outpatient Medications Prior to Visit  Medication Sig Dispense Refill  . fluticasone (FLONASE) 50 MCG/ACT nasal spray Place 1 spray into both nostrils daily.    . norgestimate-ethinyl estradiol (ORTHO-CYCLEN) 0.25-35 MG-MCG tablet Take 1 tablet by mouth daily. 3 Package 0  . OneTouch Delica Lancets 33G MISC USE TO CHECK BLOOD SUGAR DAILY 100 each 3  . Probiotic Product (PROBIOTIC DAILY) CAPS Take 1 capsule by mouth daily.    Marland Kitchen amLODipine (NORVASC) 5 MG tablet TAKE 1 TABLET(5 MG) BY MOUTH DAILY 30 tablet 0  . glucose blood (ONETOUCH VERIO) test strip USE TO CHECK BLOOD SUGAR ONCE A DAY 100 each 3  . lisinopril (ZESTRIL) 2.5 MG tablet TAKE 1 TABLET(2.5 MG) BY MOUTH DAILY 30 tablet 0   No facility-administered medications prior to visit.    ROS See HPI  Objective:  BP (!) 130/96 (BP Location: Left Arm, Patient Position: Sitting, Cuff Size: Normal)   Pulse (!) 106   Temp 97.6 F (36.4 C) (Temporal)   Ht 5\' 6"  (1.676 m)   Wt 207 lb 6.4 oz (94.1 kg)   LMP 11/16/2019   SpO2 98%   BMI 33.48 kg/m   BP Readings from Last 3 Encounters:  11/24/19 (!) 130/96  12/24/18 131/80  12/09/18 132/70    Wt  Readings from Last 3 Encounters:  11/24/19 207 lb 6.4 oz (94.1 kg)  12/24/18 212 lb (96.2 kg)  12/09/18 212 lb 1.3 oz (96.2 kg)    Physical Exam Vitals reviewed.  Constitutional:      Appearance: She is obese.  Cardiovascular:     Rate and Rhythm: Normal rate and regular rhythm.     Pulses: Normal pulses.     Heart sounds: Normal heart sounds.  Pulmonary:     Effort: Pulmonary effort is normal.     Breath sounds: Normal breath sounds.  Musculoskeletal:     Right lower leg: No edema.     Left lower leg: No edema.  Skin:    General: Skin is warm and dry.     Comments: Normal diabetic foot exam  Neurological:     Mental Status: She is alert and oriented to person, place, and time.  Psychiatric:        Mood and Affect: Mood normal.        Behavior: Behavior normal.        Thought Content: Thought content normal.     Lab Results  Component Value Date   WBC 6.2 12/24/2018   HGB 11.3 (L) 12/24/2018   HCT 33.5 (L) 12/24/2018   PLT 378.0 12/24/2018   GLUCOSE 246 (H) 11/24/2019  CHOL 177 11/24/2019   TRIG 159.0 (H) 11/24/2019   HDL 47.50 11/24/2019   LDLDIRECT 114.0 05/18/2017   LDLCALC 98 11/24/2019   ALT 11 12/24/2018   AST 12 12/24/2018   NA 136 11/24/2019   K 4.1 11/24/2019   CL 100 11/24/2019   CREATININE 0.91 11/24/2019   BUN 14 11/24/2019   CO2 25 11/24/2019   TSH 1.01 12/24/2018   HGBA1C 7.1 (H) 11/24/2019   MICROALBUR 2.5 (H) 11/24/2019    Assessment & Plan:  This visit occurred during the SARS-CoV-2 public health emergency.  Safety protocols were in place, including screening questions prior to the visit, additional usage of staff PPE, and extensive cleaning of exam room while observing appropriate contact time as indicated for disinfecting solutions.   Laura Miller was seen today for establish care.  Diagnoses and all orders for this visit:  Essential hypertension -     amLODipine (NORVASC) 5 MG tablet; TAKE 1 TABLET(5 MG) BY MOUTH DAILY -     lisinopril  (ZESTRIL) 2.5 MG tablet; TAKE 1 TABLET(2.5 MG) BY MOUTH DAILY -     Basic metabolic panel  Microalbuminuria -     Microalbumin / creatinine urine ratio -     metFORMIN (GLUCOPHAGE-XR) 500 MG 24 hr tablet; Take 1 tablet (500 mg total) by mouth 2 (two) times daily with a meal.  Type 2 diabetes mellitus with other specified complication, without long-term current use of insulin (HCC) -     Hemoglobin A1c -     Microalbumin / creatinine urine ratio -     Basic metabolic panel -     glucose blood (ONETOUCH VERIO) test strip; USE TO CHECK BLOOD SUGAR ONCE A DAY -     metFORMIN (GLUCOPHAGE-XR) 500 MG 24 hr tablet; Take 1 tablet (500 mg total) by mouth 2 (two) times daily with a meal.  Encounter for lipid screening for cardiovascular disease -     Lipid panel   I am having Laura Miller start on metFORMIN. I am also having her maintain her fluticasone, Probiotic Daily, OneTouch Delica Lancets 50D, norgestimate-ethinyl estradiol, amLODipine, lisinopril, and OneTouch Verio.  Meds ordered this encounter  Medications  . amLODipine (NORVASC) 5 MG tablet    Sig: TAKE 1 TABLET(5 MG) BY MOUTH DAILY    Dispense:  90 tablet    Refill:  3    Patient needs ov  . lisinopril (ZESTRIL) 2.5 MG tablet    Sig: TAKE 1 TABLET(2.5 MG) BY MOUTH DAILY    Dispense:  90 tablet    Refill:  3  . glucose blood (ONETOUCH VERIO) test strip    Sig: USE TO CHECK BLOOD SUGAR ONCE A DAY    Dispense:  100 each    Refill:  3    Order Specific Question:   Supervising Provider    Answer:   Ronnald Nian [[id number]]  . metFORMIN (GLUCOPHAGE-XR) 500 MG 24 hr tablet    Sig: Take 1 tablet (500 mg total) by mouth 2 (two) times daily with a meal.    Dispense:  180 tablet    Refill:  3    Order Specific Question:   Supervising Provider    Answer:   Ronnald Nian [[id number]]    Problem List Items Addressed This Visit      Cardiovascular and Mediastinum   HTN (hypertension), benign - Primary    BP at goal with  lisinopril and amlodipine BP Readings from Last 3 Encounters:  11/24/19 (!) 130/96  12/24/18 131/80  12/09/18 132/70        Relevant Medications   amLODipine (NORVASC) 5 MG tablet   lisinopril (ZESTRIL) 2.5 MG tablet     Endocrine   DM (diabetes mellitus) (HCC)    HgbA1c at 7.1 with positive urine microalbumin. Continue lisinopril for renal protection.Start metformin 500mg  BID. New rx sent Abnormal lipid panel due to elevated triglyceride. This can improve with heart healthy diet and daily exercise. Normal renal function. F/up in 57months.       Relevant Medications   lisinopril (ZESTRIL) 2.5 MG tablet   glucose blood (ONETOUCH VERIO) test strip   metFORMIN (GLUCOPHAGE-XR) 500 MG 24 hr tablet   Other Relevant Orders   Hemoglobin A1c (Completed)   Microalbumin / creatinine urine ratio (Completed)   Basic metabolic panel (Completed)     Other   Microalbuminuria   Relevant Medications   metFORMIN (GLUCOPHAGE-XR) 500 MG 24 hr tablet   Other Relevant Orders   Microalbumin / creatinine urine ratio (Completed)    Other Visit Diagnoses    Encounter for lipid screening for cardiovascular disease       Relevant Orders   Lipid panel (Completed)      Follow-up: Return in about 6 months (around 05/26/2020) for DM and HTN (fasting).  05/28/2020, NP

## 2019-11-24 NOTE — Patient Instructions (Addendum)
HgbA1c at 7.1 with positive urine microalbumin. Continue lisinopril for renal protection.Start metformin 500mg  BID. New rx sent Abnormal lipid panel due to elevated triglyceride. This can improve with heart healthy diet and daily exercise. Normal renal function. F/up in 42months.

## 2019-11-26 ENCOUNTER — Encounter: Payer: Self-pay | Admitting: Nurse Practitioner

## 2019-11-28 MED ORDER — METFORMIN HCL ER 500 MG PO TB24: 500.0000 mg | ORAL_TABLET | Freq: Two times a day (BID) | ORAL | 3 refills | Status: DC

## 2019-11-28 NOTE — Assessment & Plan Note (Signed)
HgbA1c at 7.1 with positive urine microalbumin. Continue lisinopril for renal protection.Start metformin 500mg BID. New rx sent Abnormal lipid panel due to elevated triglyceride. This can improve with heart healthy diet and daily exercise. Normal renal function. F/up in 6months.  

## 2019-11-28 NOTE — Assessment & Plan Note (Signed)
BP at goal with lisinopril and amlodipine BP Readings from Last 3 Encounters:  11/24/19 (!) 130/96  12/24/18 131/80  12/09/18 132/70

## 2019-11-28 NOTE — Assessment & Plan Note (Signed)
>>  ASSESSMENT AND PLAN FOR TYPE 2 DIABETES MELLITUS WITH HYPERGLYCEMIA, WITHOUT LONG-TERM CURRENT USE OF INSULIN (HCC) WRITTEN ON 11/28/2019  9:06 AM BY Phylicia Mcgaugh LUM, NP  HgbA1c at 7.1 with positive urine microalbumin. Continue lisinopril  for renal protection.Start metformin  500mg  BID. New rx sent Abnormal lipid panel due to elevated triglyceride. This can improve with heart healthy diet and daily exercise. Normal renal function. F/up in 6months.

## 2019-12-29 ENCOUNTER — Ambulatory Visit: Payer: BC Managed Care – PPO | Admitting: Obstetrics & Gynecology

## 2020-01-26 ENCOUNTER — Ambulatory Visit (INDEPENDENT_AMBULATORY_CARE_PROVIDER_SITE_OTHER): Payer: BLUE CROSS/BLUE SHIELD | Admitting: Obstetrics & Gynecology

## 2020-01-26 ENCOUNTER — Other Ambulatory Visit (HOSPITAL_COMMUNITY)
Admission: RE | Admit: 2020-01-26 | Discharge: 2020-01-26 | Disposition: A | Discharge status: Home / Self Care | Payer: BLUE CROSS/BLUE SHIELD | Source: Ambulatory Visit | Attending: Obstetrics & Gynecology | Admitting: Obstetrics & Gynecology

## 2020-01-26 ENCOUNTER — Other Ambulatory Visit: Payer: Self-pay

## 2020-01-26 ENCOUNTER — Encounter: Payer: Self-pay | Admitting: Obstetrics & Gynecology

## 2020-01-26 VITALS — BP 129/77 | HR 86 | Ht 65.0 in | Wt 209.0 lb

## 2020-01-26 DIAGNOSIS — Z3041 Encounter for surveillance of contraceptive pills: Secondary | ICD-10-CM | POA: Diagnosis not present

## 2020-01-26 DIAGNOSIS — Z3009 Encounter for other general counseling and advice on contraception: Secondary | ICD-10-CM

## 2020-01-26 DIAGNOSIS — Z113 Encounter for screening for infections with a predominantly sexual mode of transmission: Secondary | ICD-10-CM | POA: Insufficient documentation

## 2020-01-26 DIAGNOSIS — Z01419 Encounter for gynecological examination (general) (routine) without abnormal findings: Secondary | ICD-10-CM

## 2020-01-26 MED ORDER — NORGESTIMATE-ETH ESTRADIOL 0.25-35 MG-MCG PO TABS: 1.0000 | ORAL_TABLET | Freq: Every day | ORAL | 4 refills | Status: DC

## 2020-01-26 NOTE — Progress Notes (Signed)
Subjective:     Laura Miller is a 34 y.o. female here for a routine exam.  Current complaints:  None. Sexually active; monogamous.    On OCPs with no issues. Recently her younger sister died. Was begin managed for Cardiac disease.     Gynecologic History Patient's last menstrual period was 01/11/2020. Contraception: OCP (estrogen/progesterone) Last Pap: 12/21/2018. Results were: normal Last mammogram: n/a.   Obstetric History OB History  Gravida Para Term Preterm AB Living  0 0 0 0 0 0  SAB TAB Ectopic Multiple Live Births  0 0 0 0 0   The following portions of the patient's history were reviewed and updated as appropriate: allergies, current medications, past family history, past medical history, past social history, past surgical history and problem list.  Review of Systems Pertinent items are noted in HPI.    Objective:  BP (!) 129/77   Pulse 86   Ht 5\' 5"  (1.651 m)   Wt (!) 209 lb (94.8 kg)   LMP 01/11/2020   BMI 34.78 kg/m  General Appearance:    Alert, cooperative, no distress, appears stated age  Head:    Normocephalic, without obvious abnormality, atraumatic  Eyes:    conjunctiva/corneas clear, EOM's intact, both eyes  Ears:    Normal external ear canals, both ears  Nose:   Nares normal, septum midline, mucosa normal, no drainage    or sinus tenderness  Throat:   Lips, mucosa, and tongue normal; teeth and gums normal  Neck:   Supple, symmetrical, trachea midline, no adenopathy;    thyroid:  no enlargement/tenderness/nodules  Back:     Symmetric, no curvature, ROM normal, no CVA tenderness  Lungs:     respirations unlabored  Chest Wall:    No tenderness or deformity   Heart:    Regular rate and rhythm  Breast Exam:    No tenderness, masses, or nipple abnormality  Abdomen:     Soft, non-tender, bowel sounds active all four quadrants,    no masses, no organomegaly  Genitalia:    Normal female without lesion, discharge or tenderness     Extremities:   Extremities  normal, atraumatic, no cyanosis or edema  Pulses:   2+ and symmetric all extremities  Skin:   Skin color, texture, turgor normal, no rashes or lesions    Assessment:    Healthy female exam.  PAP WNL in 2020. NO history of abnormal  STI screen  Sister cardiac disease. Rec that pt take her sisters meds to her primary care provider and see if she needs any specific screening for heart disease. Her sister had an autopsy but, the results wont be  back until early Aug.     Plan:    f/u in 1 year  Laura Miller was seen today for annual exam.  Diagnoses and all orders for this visit:  Well female exam with routine gynecological exam -     Cervicovaginal ancillary only( [place]) -     HIV antibody (with reflex) -     RPR -     Hepatitis B Surface AntiGEN -     Hepatitis C Antibody  Screening for STD (sexually transmitted disease) -     Cervicovaginal ancillary only( [place]) -     HIV antibody (with reflex) -     RPR -     Hepatitis B Surface AntiGEN -     Hepatitis C Antibody  Encounter for surveillance of contraceptive pills -  norgestimate-ethinyl estradiol (ORTHO-CYCLEN) 0.25-35 MG-MCG tablet; Take 1 tablet by mouth daily.  Encounter for counseling regarding contraception -     norgestimate-ethinyl estradiol (ORTHO-CYCLEN) 0.25-35 MG-MCG tablet; Take 1 tablet by mouth daily.

## 2020-01-27 LAB — CERVICOVAGINAL ANCILLARY ONLY
Bacterial Vaginitis (gardnerella): POSITIVE — AB
Candida Glabrata: NEGATIVE
Candida Vaginitis: NEGATIVE
Chlamydia: NEGATIVE
Comment: NEGATIVE
Comment: NEGATIVE
Comment: NEGATIVE
Comment: NEGATIVE
Comment: NEGATIVE
Comment: NORMAL
Neisseria Gonorrhea: NEGATIVE
Trichomonas: NEGATIVE

## 2020-01-27 LAB — HEPATITIS C ANTIBODY: Hep C Virus Ab: <0.1 s/co ratio (ref 0.0–0.9)

## 2020-01-27 LAB — RPR: RPR Ser Ql: NONREACTIVE

## 2020-01-27 LAB — HIV ANTIBODY (ROUTINE TESTING W REFLEX): HIV Screen 4th Generation wRfx: NONREACTIVE

## 2020-01-27 LAB — HEPATITIS B SURFACE ANTIGEN: Hepatitis B Surface Ag: NEGATIVE

## 2020-01-28 ENCOUNTER — Telehealth: Payer: Self-pay

## 2020-01-28 DIAGNOSIS — B9689 Other specified bacterial agents as the cause of diseases classified elsewhere: Secondary | ICD-10-CM

## 2020-01-28 MED ORDER — METRONIDAZOLE 500 MG PO TABS: 500.0000 mg | ORAL_TABLET | Freq: Two times a day (BID) | ORAL | 0 refills | Status: DC

## 2020-01-28 NOTE — Telephone Encounter (Signed)
Called pt regarding positive BV results. Left message for pt to call the office back. Laura Miller l Laura Miller, CMA

## 2020-01-28 NOTE — Telephone Encounter (Signed)
Flagyl 500 mg BID x 7 days was sent to pt's pharmacy. Fayette Gasner l Kassi Esteve, CMA

## 2020-02-04 ENCOUNTER — Other Ambulatory Visit: Payer: Self-pay

## 2020-02-05 ENCOUNTER — Ambulatory Visit (INDEPENDENT_AMBULATORY_CARE_PROVIDER_SITE_OTHER): Payer: BLUE CROSS/BLUE SHIELD | Admitting: Nurse Practitioner

## 2020-02-05 ENCOUNTER — Encounter: Payer: Self-pay | Admitting: Nurse Practitioner

## 2020-02-05 VITALS — BP 114/84 | HR 91 | Temp 98.7°F | Ht 65.0 in | Wt 208.0 lb

## 2020-02-05 DIAGNOSIS — I10 Essential (primary) hypertension: Secondary | ICD-10-CM

## 2020-02-05 DIAGNOSIS — Z634 Disappearance and death of family member: Secondary | ICD-10-CM

## 2020-02-05 NOTE — Patient Instructions (Signed)
Normal ECG today You will be contacted to schedule appt with cardiology.

## 2020-02-05 NOTE — Progress Notes (Signed)
Subjective:  Patient ID: Laura Miller, female    DOB: 1986-01-21  Age: 34 y.o. MRN: [id number]  CC: F/O of Heart Issues (Younger sister passed in July with possible heart issues. GYN suggested she see PCP. Possible EKG.)  HPI Laura Miller is concern about possible cardiac etiology as cause of sister's sudden death. Sister was 42yrs old. She is still waiting for autopsy results. Sister was under cardiology care due to syncope of unknown cause. Denies any known Fhx of premature CAD or HCM or DVT/PE or arrhythmia or CVA. She denies any CP, SOB, LE edema or palpitation or syncope.  Reviewed past Medical, Social and Family history today.  Outpatient Medications Prior to Visit  Medication Sig Dispense Refill  . amLODipine (NORVASC) 5 MG tablet TAKE 1 TABLET(5 MG) BY MOUTH DAILY 90 tablet 3  . fluticasone (FLONASE) 50 MCG/ACT nasal spray Place 1 spray into both nostrils daily.    Marland Kitchen glucose blood (ONETOUCH VERIO) test strip USE TO CHECK BLOOD SUGAR ONCE A DAY 100 each 3  . lisinopril (ZESTRIL) 2.5 MG tablet TAKE 1 TABLET(2.5 MG) BY MOUTH DAILY 90 tablet 3  . metFORMIN (GLUCOPHAGE-XR) 500 MG 24 hr tablet Take 1 tablet (500 mg total) by mouth 2 (two) times daily with a meal. 180 tablet 3  . norgestimate-ethinyl estradiol (ORTHO-CYCLEN) 0.25-35 MG-MCG tablet Take 1 tablet by mouth daily. 3 tablet 4  . OneTouch Delica Lancets 33G MISC USE TO CHECK BLOOD SUGAR DAILY 100 each 3  . Probiotic Product (PROBIOTIC DAILY) CAPS Take 1 capsule by mouth daily.    . metroNIDAZOLE (FLAGYL) 500 MG tablet Take 1 tablet (500 mg total) by mouth 2 (two) times daily. 14 tablet 0   No facility-administered medications prior to visit.    ROS See HPI  Objective:  BP 114/84 (BP Location: Left Arm, Patient Position: Sitting, Cuff Size: Large)   Pulse 91   Temp 98.7 F (37.1 C) (Oral)   Ht 5\' 5"  (1.651 m)   Wt 208 lb (94.3 kg)   LMP 01/11/2020   SpO2 100%   BMI 34.61 kg/m   ECG: NSR, no previous ECG to  compare.  Physical Exam Constitutional:      Appearance: She is obese.  Cardiovascular:     Rate and Rhythm: Normal rate and regular rhythm.     Pulses: Normal pulses.     Heart sounds: Normal heart sounds.  Pulmonary:     Effort: Pulmonary effort is normal.     Breath sounds: Normal breath sounds.  Musculoskeletal:     Right lower leg: No edema.     Left lower leg: No edema.  Neurological:     Mental Status: She is alert and oriented to person, place, and time.    Assessment & Plan:  This visit occurred during the SARS-CoV-2 public health emergency.  Safety protocols were in place, including screening questions prior to the visit, additional usage of staff PPE, and extensive cleaning of exam room while observing appropriate contact time as indicated for disinfecting solutions.   Laura Miller was seen today for f/o of heart issues.  Diagnoses and all orders for this visit:  HTN (hypertension), benign -     EKG 12-Lead   Problem List Items Addressed This Visit      Cardiovascular and Mediastinum   HTN (hypertension), benign - Primary   Relevant Orders   EKG 12-Lead (Completed)      Follow-up: Return if symptoms worsen or fail to improve.  Lurena Joiner,  NP

## 2020-02-10 ENCOUNTER — Other Ambulatory Visit: Payer: Self-pay | Admitting: Family Medicine

## 2020-02-10 DIAGNOSIS — Z3009 Encounter for other general counseling and advice on contraception: Secondary | ICD-10-CM

## 2020-02-10 DIAGNOSIS — Z3041 Encounter for surveillance of contraceptive pills: Secondary | ICD-10-CM

## 2020-03-05 ENCOUNTER — Encounter: Payer: Self-pay | Admitting: Interventional Cardiology

## 2020-03-05 ENCOUNTER — Other Ambulatory Visit: Payer: Self-pay

## 2020-03-05 ENCOUNTER — Ambulatory Visit (INDEPENDENT_AMBULATORY_CARE_PROVIDER_SITE_OTHER): Payer: BLUE CROSS/BLUE SHIELD | Admitting: Interventional Cardiology

## 2020-03-05 VITALS — BP 148/72 | HR 102 | Ht 65.0 in | Wt 209.0 lb

## 2020-03-05 DIAGNOSIS — Z8241 Family history of sudden cardiac death: Secondary | ICD-10-CM | POA: Diagnosis not present

## 2020-03-05 DIAGNOSIS — R000 Tachycardia, unspecified: Secondary | ICD-10-CM

## 2020-03-05 DIAGNOSIS — I10 Essential (primary) hypertension: Secondary | ICD-10-CM | POA: Diagnosis not present

## 2020-03-05 DIAGNOSIS — E119 Type 2 diabetes mellitus without complications: Secondary | ICD-10-CM

## 2020-03-05 NOTE — Patient Instructions (Signed)
Medication Instructions:  Your physician recommends that you continue on your current medications as directed. Please refer to the Current Medication list given to you today.  *If you need a refill on your cardiac medications before your next appointment, please call your pharmacy*   Lab Work: None  If you have labs (blood work) drawn today and your tests are completely normal, you will receive your results only by: Marland Kitchen MyChart Message (if you have MyChart) OR . A paper copy in the mail If you have any lab test that is abnormal or we need to change your treatment, we will call you to review the results.   Testing/Procedures: Your physician has requested that you have an echocardiogram. Echocardiography is a painless test that uses sound waves to create images of your heart. It provides your doctor with information about the size and shape of your heart and how well your heart's chambers and valves are working. This procedure takes approximately one hour. There are no restrictions for this procedure.  Follow-Up: AS NEEDED  Other Instructions  High-Fiber Diet Fiber, also called dietary fiber, is a type of carbohydrate that is found in fruits, vegetables, whole grains, and beans. A high-fiber diet can have many health benefits. Your health care provider may recommend a high-fiber diet to help:  Prevent constipation. Fiber can make your bowel movements more regular.  Lower your cholesterol.  Relieve the following conditions: ? Swelling of veins in the anus (hemorrhoids). ? Swelling and irritation (inflammation) of specific areas of the digestive tract (uncomplicated diverticulosis). ? A problem of the large intestine (colon) that sometimes causes pain and diarrhea (irritable bowel syndrome, IBS).  Prevent overeating as part of a weight-loss plan.  Prevent heart disease, type 2 diabetes, and certain cancers. What is my plan? The recommended daily fiber intake in grams (g) includes:   38 g for men age 54 or younger.  30 g for men over age 34.  25 g for women age 30 or younger.  21 g for women over age 64. You can get the recommended daily intake of dietary fiber by:  Eating a variety of fruits, vegetables, grains, and beans.  Taking a fiber supplement, if it is not possible to get enough fiber through your diet. What do I need to know about a high-fiber diet?  It is better to get fiber through food sources rather than from fiber supplements. There is not a lot of research about how effective supplements are.  Always check the fiber content on the nutrition facts label of any prepackaged food. Look for foods that contain 5 g of fiber or more per serving.  Talk with a diet and nutrition specialist (dietitian) if you have questions about specific foods that are recommended or not recommended for your medical condition, especially if those foods are not listed below.  Gradually increase how much fiber you consume. If you increase your intake of dietary fiber too quickly, you may have bloating, cramping, or gas.  Drink plenty of water. Water helps you to digest fiber. What are tips for following this plan?  Eat a wide variety of high-fiber foods.  Make sure that half of the grains that you eat each day are whole grains.  Eat breads and cereals that are made with whole-grain flour instead of refined flour or white flour.  Eat brown rice, bulgur wheat, or millet instead of white rice.  Start the day with a breakfast that is high in fiber, such as a cereal  that contains 5 g of fiber or more per serving.  Use beans in place of meat in soups, salads, and pasta dishes.  Eat high-fiber snacks, such as berries, raw vegetables, nuts, and popcorn.  Choose whole fruits and vegetables instead of processed forms like juice or sauce. What foods can I eat?  Fruits Berries. Pears. Apples. Oranges. Avocado. Prunes and raisins. Dried figs. Vegetables Sweet potatoes. Spinach.  Kale. Artichokes. Cabbage. Broccoli. Cauliflower. Green peas. Carrots. Squash. Grains Whole-grain breads. Multigrain cereal. Oats and oatmeal. Brown rice. Barley. Bulgur wheat. Millet. Quinoa. Bran muffins. Popcorn. Rye wafer crackers. Meats and other proteins Navy, kidney, and pinto beans. Soybeans. Split peas. Lentils. Nuts and seeds. Dairy Fiber-fortified yogurt. Beverages Fiber-fortified soy milk. Fiber-fortified orange juice. Other foods Fiber bars. The items listed above may not be a complete list of recommended foods and beverages. Contact a dietitian for more options. What foods are not recommended? Fruits Fruit juice. Cooked, strained fruit. Vegetables Fried potatoes. Canned vegetables. Well-cooked vegetables. Grains White bread. Pasta made with refined flour. White rice. Meats and other proteins Fatty cuts of meat. Fried chicken or fried fish. Dairy Milk. Yogurt. Cream cheese. Sour cream. Fats and oils Butters. Beverages Soft drinks. Other foods Cakes and pastries. The items listed above may not be a complete list of foods and beverages to avoid. Contact a dietitian for more information. Summary  Fiber is a type of carbohydrate. It is found in fruits, vegetables, whole grains, and beans.  There are many health benefits of eating a high-fiber diet, such as preventing constipation, lowering blood cholesterol, helping with weight loss, and reducing your risk of heart disease, diabetes, and certain cancers.  Gradually increase your intake of fiber. Increasing too fast can result in cramping, bloating, and gas. Drink plenty of water while you increase your fiber.  The best sources of fiber include whole fruits and vegetables, whole grains, nuts, seeds, and beans. This information is not intended to replace advice given to you by your health care provider. Make sure you discuss any questions you have with your health care provider. Document Revised: 04/23/2017 Document  Reviewed: 04/23/2017 Elsevier Patient Education  2020 [employer].

## 2020-03-05 NOTE — Progress Notes (Addendum)
Cardiology Office Note   Date:  03/05/2020   ID:  Laura Miller, DOB 1986/06/03, MRN [id number]  PCP:  Anne Ng, NP    No chief complaint on file.  Cardiac eval, HTN  Wt Readings from Last 3 Encounters:  03/05/20 209 lb (94.8 kg)  02/05/20 208 lb (94.3 kg)  01/26/20 (!) 209 lb (94.8 kg)       History of Present Illness: Laura Miller is a 34 y.o. female  Has a concern for heart disease due to recent sudden death in her sister in 02-07-2020.  Her sister had an autopsy but results are not back.  She had been having chest pains, syncope, dizziness, fatigue.  She had seen a cardiologist since 2020, and sx got more frequent in 2021.  She was on medicines, fluorinef and midodrine, to help keep BP up.  She had been feeling well but then had diarrhea for a couple of days prior to the day she passed.  She planned to go to the [facility], but did not make it.  She was found in her bathroom sitting on a chair by EMS and was already gone.   Records show: "concern about possible cardiac etiology as cause of sister's sudden death. Sister was 68yrs old. She is still waiting for autopsy results. Sister was under cardiology care due to syncope of unknown cause. Denies any known Fhx of premature CAD or HCM or DVT/PE or arrhythmia or CVA. She denies any CP, SOB, LE edema or palpitation or syncope."  Other than her sister, the patient reports that no cardiac history on the Mom's side of the family.  On father's side, she had a second cousin who passed away suddenly.    She has been feeling well.  She has not been exercising on a regular basis.    She has been on meds for HTN for about 2 years; and DM meds this year.  Exercise decreased during COVID.   She has not been vaccinated. She is concerned because she got sick after the first time she took a flu shot.   She had COVID earlier in the year, and sx were loss of smell. Sister had many more severe sx when she had it.  Denies : exertional  Chest pain. Dizziness. Leg edema. Nitroglycerin use. Orthopnea. Palpitations. Paroxysmal nocturnal dyspnea. Shortness of breath. Syncope.     Past Medical History:  Diagnosis Date  . Chicken pox   . Diabetes mellitus type 2 in obese (HCC)   . History of jaundice    infant  . HTN (hypertension), benign     Past Surgical History:  Procedure Laterality Date  . NO PAST SURGERIES       Current Outpatient Medications  Medication Sig Dispense Refill  . amLODipine (NORVASC) 5 MG tablet TAKE 1 TABLET(5 MG) BY MOUTH DAILY 90 tablet 3  . fluticasone (FLONASE) 50 MCG/ACT nasal spray Place 1 spray into both nostrils daily.    Marland Kitchen glucose blood (ONETOUCH VERIO) test strip USE TO CHECK BLOOD SUGAR ONCE A DAY 100 each 3  . lisinopril (ZESTRIL) 2.5 MG tablet TAKE 1 TABLET(2.5 MG) BY MOUTH DAILY 90 tablet 3  . metFORMIN (GLUCOPHAGE-XR) 500 MG 24 hr tablet Take 1 tablet (500 mg total) by mouth 2 (two) times daily with a meal. 180 tablet 3  . norgestimate-ethinyl estradiol (ORTHO-CYCLEN) 0.25-35 MG-MCG tablet Take 1 tablet by mouth daily. 3 tablet 4  . OneTouch Delica Lancets 33G MISC USE TO CHECK BLOOD  SUGAR DAILY 100 each 3  . Probiotic Product (PROBIOTIC DAILY) CAPS Take 1 capsule by mouth daily.     No current facility-administered medications for this visit.    Allergies:   Patient has no known allergies.    Social History:  The patient  reports that she has never smoked. She has never used smokeless tobacco. She reports that she does not drink alcohol and does not use drugs.   Family History:  The patient's family history includes Diabetes in her father, maternal grandmother, mother, paternal grandfather, paternal grandmother, and sister; Hyperlipidemia in her maternal grandfather and maternal grandmother.    ROS:  Please see the history of present illness.   Otherwise, review of systems are positive for less exercise.   All other systems are reviewed and negative.    PHYSICAL EXAM: VS:   BP (!) 148/72   Pulse (!) 102   Ht 5\' 5"  (1.651 m)   Wt 209 lb (94.8 kg)   SpO2 97%   BMI 34.78 kg/m  , BMI Body mass index is 34.78 kg/m. GEN: Well nourished, well developed, in no acute distress  HEENT: normal  Neck: no JVD, carotid bruits, or masses Cardiac: RRR; no murmurs, rubs, or gallops,no edema  Respiratory:  clear to auscultation bilaterally, normal work of breathing GI: soft, nontender, nondistended, + BS MS: no deformity or atrophy  Skin: warm and dry, no rash Neuro:  Strength and sensation are intact Psych: euthymic mood, full affect   EKG:   The ekg ordered 02/05/20 demonstrates normal sinus rhythm, no ST changes, normal intervals   Recent Labs: 11/24/2019: BUN 14; Creatinine, Ser 0.91; Potassium 4.1; Sodium 136   Lipid Panel    Component Value Date/Time   CHOL 177 11/24/2019 1104   TRIG 159.0 (H) 11/24/2019 1104   HDL 47.50 11/24/2019 1104   CHOLHDL 4 11/24/2019 1104   VLDL 31.8 11/24/2019 1104   LDLCALC 98 11/24/2019 1104   LDLDIRECT 114.0 05/18/2017 1058     Other studies Reviewed: Additional studies/ records that were reviewed today with results demonstrating: labs reviewed.   ASSESSMENT AND PLAN:  1. HTN: Elevated today.  Planning to restart Increase exercise.  Check BP at home.  Reduce salt.  Avoid processed foods.  2. Tachycardia: Check echo.  SHe has had episodic tachycardia noted by her in the past. 3. Family h/o sudden cardiac death.  Check echo to eval for structural heart disease.  Unclear what the circumstances around her sister's death were.  She had orthostatic hypotension and subsequent diarrhea.  She may have been severely dehydrated.  She could have had a significant electrolyte disturbance.  Patient states there is still waiting on autopsy result.  If we get more data from that autopsy, could consider more targeted testing. 4. DM: A1C 7.1.  Whole food plant based recommended.  Increased exercise will also help. 5. I recommended the COVID  vaccine to her.    Current medicines are reviewed at length with the patient today.  The patient concerns regarding her medicines were addressed.  The following changes have been made:  No change  Labs/ tests ordered today include:  No orders of the defined types were placed in this encounter.   Recommend 150 minutes/week of aerobic exercise Low fat, low carb, high fiber diet recommended  Disposition:   F/U based on echo results   Signed, 05/20/2017, MD  03/05/2020 8:56 AM    [facility] [address] [place], [place], [place]  [address] Phone: [phone]; Fax: [phone]

## 2020-03-23 ENCOUNTER — Other Ambulatory Visit: Payer: Self-pay

## 2020-03-23 ENCOUNTER — Ambulatory Visit (HOSPITAL_COMMUNITY): Payer: BLUE CROSS/BLUE SHIELD | Attending: Cardiovascular Disease

## 2020-03-23 DIAGNOSIS — R000 Tachycardia, unspecified: Secondary | ICD-10-CM | POA: Insufficient documentation

## 2020-03-23 LAB — ECHOCARDIOGRAM COMPLETE
Area-P 1/2: 3.21 cm2
S' Lateral: 3.1 cm

## 2020-05-24 ENCOUNTER — Other Ambulatory Visit: Payer: Self-pay

## 2020-05-24 ENCOUNTER — Ambulatory Visit: Payer: PRIVATE HEALTH INSURANCE | Admitting: Nurse Practitioner

## 2020-05-24 ENCOUNTER — Encounter: Payer: Self-pay | Admitting: Nurse Practitioner

## 2020-05-24 VITALS — BP 120/76 | HR 97 | Temp 96.9°F | Ht 65.0 in | Wt 211.6 lb

## 2020-05-24 DIAGNOSIS — R809 Proteinuria, unspecified: Secondary | ICD-10-CM | POA: Diagnosis not present

## 2020-05-24 DIAGNOSIS — I10 Essential (primary) hypertension: Secondary | ICD-10-CM

## 2020-05-24 DIAGNOSIS — E1169 Type 2 diabetes mellitus with other specified complication: Secondary | ICD-10-CM | POA: Diagnosis not present

## 2020-05-24 LAB — BASIC METABOLIC PANEL
BUN: 11 mg/dL (ref 6–23)
CO2: 26 mEq/L (ref 19–32)
Calcium: 9.3 mg/dL (ref 8.4–10.5)
Chloride: 100 mEq/L (ref 96–112)
Creatinine, Ser: 0.94 mg/dL (ref 0.40–1.20)
GFR: 79.51 mL/min (ref >60.00)
Glucose, Bld: 191 mg/dL — ABNORMAL HIGH (ref 70–99)
Potassium: 4 mEq/L (ref 3.5–5.1)
Sodium: 136 mEq/L (ref 135–145)

## 2020-05-24 LAB — HEMOGLOBIN A1C: Hgb A1c MFr Bld: 7 % — ABNORMAL HIGH (ref 4.6–6.5)

## 2020-05-24 MED ORDER — METFORMIN HCL ER 500 MG PO TB24: 500.0000 mg | ORAL_TABLET | Freq: Every day | ORAL | 3 refills | Status: DC

## 2020-05-24 NOTE — Progress Notes (Signed)
Subjective:  Patient ID: Laura Miller, female    DOB: 15-May-1986  Age: 34 y.o. MRN: [id number]  CC: Follow-up (6 month f/u on DM and HTN. Pt is fasting. Blood sugars ranging 164-213)   HPI  HTN (hypertension), benign BP at goal BP Readings from Last 3 Encounters:  05/24/20 120/76  03/05/20 (!) 148/72  02/05/20 114/84   Continue current medications Repeat BMP  DM (diabetes mellitus) (HCC) glucose fasting: 150-160 Post prandial glucose:200-220 Admits to not making adequate dietary choices and lack of exercise. Side effects with metformin: nausea, ABD pain , fatigue. She was taking med 2hrs after meals.  Repeat hgbA1c and BMP Advised to take medication immediately after meal.   Wt Readings from Last 3 Encounters:  05/24/20 211 lb 9.6 oz (96 kg)  03/05/20 209 lb (94.8 kg)  02/05/20 208 lb (94.3 kg)   Reviewed past Medical, Social and Family history today.  Outpatient Medications Prior to Visit  Medication Sig Dispense Refill  . amLODipine (NORVASC) 5 MG tablet TAKE 1 TABLET(5 MG) BY MOUTH DAILY 90 tablet 3  . fluticasone (FLONASE) 50 MCG/ACT nasal spray Place 1 spray into both nostrils daily.    Marland Kitchen glucose blood (ONETOUCH VERIO) test strip USE TO CHECK BLOOD SUGAR ONCE A DAY 100 each 3  . lisinopril (ZESTRIL) 2.5 MG tablet TAKE 1 TABLET(2.5 MG) BY MOUTH DAILY 90 tablet 3  . norgestimate-ethinyl estradiol (ORTHO-CYCLEN) 0.25-35 MG-MCG tablet Take 1 tablet by mouth daily. 3 tablet 4  . OneTouch Delica Lancets 33G MISC USE TO CHECK BLOOD SUGAR DAILY 100 each 3  . Probiotic Product (PROBIOTIC DAILY) CAPS Take 1 capsule by mouth daily.    . metFORMIN (GLUCOPHAGE-XR) 500 MG 24 hr tablet Take 1 tablet (500 mg total) by mouth 2 (two) times daily with a meal. 180 tablet 3   No facility-administered medications prior to visit.    ROS See HPI  Objective:  BP 120/76 (BP Location: Left Arm, Patient Position: Sitting, Cuff Size: Large)   Pulse 97   Temp (!) 96.9 F (36.1  C) (Temporal)   Ht 5\' 5"  (1.651 m)   Wt 211 lb 9.6 oz (96 kg)   SpO2 99%   BMI 35.21 kg/m   Physical Exam Constitutional:      Appearance: She is obese.  Cardiovascular:     Rate and Rhythm: Normal rate and regular rhythm.     Pulses: Normal pulses.  Pulmonary:     Effort: Pulmonary effort is normal.  Musculoskeletal:     Right lower leg: No edema.     Left lower leg: No edema.  Neurological:     Mental Status: She is oriented to person, place, and time.    Assessment & Plan:  This visit occurred during the SARS-CoV-2 public health emergency.  Safety protocols were in place, including screening questions prior to the visit, additional usage of staff PPE, and extensive cleaning of exam room while observing appropriate contact time as indicated for disinfecting solutions.   Laura Miller was seen today for follow-up.  Diagnoses and all orders for this visit:  HTN (hypertension), benign -     Basic metabolic panel  Type 2 diabetes mellitus with other specified complication, without long-term current use of insulin (HCC) -     Basic metabolic panel -     Hemoglobin A1c -     metFORMIN (GLUCOPHAGE-XR) 500 MG 24 hr tablet; Take 1 tablet (500 mg total) by mouth daily with breakfast.  Microalbuminuria -  metFORMIN (GLUCOPHAGE-XR) 500 MG 24 hr tablet; Take 1 tablet (500 mg total) by mouth daily with breakfast.    Problem List Items Addressed This Visit      Cardiovascular and Mediastinum   HTN (hypertension), benign - Primary    BP at goal BP Readings from Last 3 Encounters:  05/24/20 120/76  03/05/20 (!) 148/72  02/05/20 114/84   Continue current medications Repeat BMP      Relevant Orders   Basic metabolic panel (Completed)     Endocrine   DM (diabetes mellitus) (HCC)    glucose fasting: 150-160 Post prandial glucose:200-220 Admits to not making adequate dietary choices and lack of exercise. Side effects with metformin: nausea, ABD pain , fatigue. She was  taking med 2hrs after meals.  Repeat hgbA1c and BMP Advised to take medication immediately after meal.       Relevant Medications   metFORMIN (GLUCOPHAGE-XR) 500 MG 24 hr tablet   Other Relevant Orders   Basic metabolic panel (Completed)   Hemoglobin A1c (Completed)     Other   Microalbuminuria   Relevant Medications   metFORMIN (GLUCOPHAGE-XR) 500 MG 24 hr tablet      Follow-up: Return in about 3 months (around 08/24/2020) for CPE (fasting).  [name], NP

## 2020-05-24 NOTE — Patient Instructions (Signed)
Go to lab for blood draw.

## 2020-05-24 NOTE — Assessment & Plan Note (Signed)
glucose fasting: 150-160 Post prandial glucose:200-220 Admits to not making adequate dietary choices and lack of exercise. Side effects with metformin: nausea, ABD pain , fatigue. She was taking med 2hrs after meals.  Repeat hgbA1c and BMP Advised to take medication immediately after meal.

## 2020-05-24 NOTE — Assessment & Plan Note (Signed)
>>  ASSESSMENT AND PLAN FOR TYPE 2 DIABETES MELLITUS WITH HYPERGLYCEMIA, WITHOUT LONG-TERM CURRENT USE OF INSULIN (HCC) WRITTEN ON 05/24/2020  9:09 AM BY Dulcie Gammon LUM, NP  glucose fasting: 150-160 Post prandial glucose:200-220 Admits to not making adequate dietary choices and lack of exercise. Side effects with metformin : nausea, ABD pain , fatigue. She was taking med 2hrs after meals.  Repeat hgbA1c and BMP Advised to take medication immediately after meal.

## 2020-05-24 NOTE — Assessment & Plan Note (Signed)
BP at goal BP Readings from Last 3 Encounters:  05/24/20 120/76  03/05/20 (!) 148/72  02/05/20 114/84   Continue current medications Repeat BMP

## 2020-08-24 ENCOUNTER — Other Ambulatory Visit: Payer: Self-pay

## 2020-08-25 ENCOUNTER — Encounter: Payer: Self-pay | Admitting: Nurse Practitioner

## 2020-08-25 ENCOUNTER — Ambulatory Visit (INDEPENDENT_AMBULATORY_CARE_PROVIDER_SITE_OTHER): Payer: PRIVATE HEALTH INSURANCE | Admitting: Nurse Practitioner

## 2020-08-25 VITALS — BP 120/86 | HR 110 | Temp 97.4°F | Ht 66.0 in | Wt 221.0 lb

## 2020-08-25 DIAGNOSIS — I10 Essential (primary) hypertension: Secondary | ICD-10-CM

## 2020-08-25 DIAGNOSIS — E1165 Type 2 diabetes mellitus with hyperglycemia: Secondary | ICD-10-CM | POA: Diagnosis not present

## 2020-08-25 DIAGNOSIS — Z136 Encounter for screening for cardiovascular disorders: Secondary | ICD-10-CM | POA: Diagnosis not present

## 2020-08-25 DIAGNOSIS — E010 Iodine-deficiency related diffuse (endemic) goiter: Secondary | ICD-10-CM

## 2020-08-25 DIAGNOSIS — Z0001 Encounter for general adult medical examination with abnormal findings: Secondary | ICD-10-CM | POA: Diagnosis not present

## 2020-08-25 DIAGNOSIS — E669 Obesity, unspecified: Secondary | ICD-10-CM | POA: Insufficient documentation

## 2020-08-25 DIAGNOSIS — Z13220 Encounter for screening for lipoid disorders: Secondary | ICD-10-CM

## 2020-08-25 DIAGNOSIS — Z6835 Body mass index (BMI) 35.0-35.9, adult: Secondary | ICD-10-CM

## 2020-08-25 LAB — CBC WITH DIFFERENTIAL/PLATELET
Basophils Absolute: 0 10*3/uL (ref 0.0–0.1)
Basophils Relative: 0.8 % (ref 0.0–3.0)
Eosinophils Absolute: 0.1 10*3/uL (ref 0.0–0.7)
Eosinophils Relative: 1.9 % (ref 0.0–5.0)
HCT: 33.7 % — ABNORMAL LOW (ref 36.0–46.0)
Hemoglobin: 11.7 g/dL — ABNORMAL LOW (ref 12.0–15.0)
Lymphocytes Relative: 23.7 % (ref 12.0–46.0)
Lymphs Abs: 1.3 10*3/uL (ref 0.7–4.0)
MCHC: 34.7 g/dL (ref 30.0–36.0)
MCV: 85.7 fl (ref 78.0–100.0)
Monocytes Absolute: 0.4 10*3/uL (ref 0.1–1.0)
Monocytes Relative: 6.7 % (ref 3.0–12.0)
Neutro Abs: 3.7 10*3/uL (ref 1.4–7.7)
Neutrophils Relative %: 66.9 % (ref 43.0–77.0)
Platelets: 401 10*3/uL — ABNORMAL HIGH (ref 150.0–400.0)
RBC: 3.93 Mil/uL (ref 3.87–5.11)
RDW: 13.4 % (ref 11.5–15.5)
WBC: 5.5 10*3/uL (ref 4.0–10.5)

## 2020-08-25 LAB — COMPREHENSIVE METABOLIC PANEL
ALT: 24 U/L (ref 0–35)
AST: 24 U/L (ref 0–37)
Albumin: 4.1 g/dL (ref 3.5–5.2)
Alkaline Phosphatase: 51 U/L (ref 39–117)
BUN: 13 mg/dL (ref 6–23)
CO2: 25 mEq/L (ref 19–32)
Calcium: 9.4 mg/dL (ref 8.4–10.5)
Chloride: 101 mEq/L (ref 96–112)
Creatinine, Ser: 0.85 mg/dL (ref 0.40–1.20)
GFR: 89.56 mL/min (ref >60.00)
Glucose, Bld: 220 mg/dL — ABNORMAL HIGH (ref 70–99)
Potassium: 3.9 mEq/L (ref 3.5–5.1)
Sodium: 137 mEq/L (ref 135–145)
Total Bilirubin: 0.3 mg/dL (ref 0.2–1.2)
Total Protein: 7.5 g/dL (ref 6.0–8.3)

## 2020-08-25 LAB — LIPID PANEL
Cholesterol: 198 mg/dL (ref 0–200)
HDL: 51.4 mg/dL (ref >39.00)
LDL Cholesterol: 112 mg/dL — ABNORMAL HIGH (ref 0–99)
NonHDL: 146.5
Total CHOL/HDL Ratio: 4
Triglycerides: 172 mg/dL — ABNORMAL HIGH (ref 0.0–149.0)
VLDL: 34.4 mg/dL (ref 0.0–40.0)

## 2020-08-25 LAB — HEMOGLOBIN A1C: Hgb A1c MFr Bld: 7.4 % — ABNORMAL HIGH (ref 4.6–6.5)

## 2020-08-25 LAB — TSH: TSH: 0.77 u[IU]/mL (ref 0.35–4.50)

## 2020-08-25 NOTE — Assessment & Plan Note (Signed)
BP at goal with amlodipine and lisinopril BP Readings from Last 3 Encounters:  08/25/20 120/86  05/24/20 120/76  03/05/20 (!) 148/72   Maintain medications

## 2020-08-25 NOTE — Assessment & Plan Note (Addendum)
Repeat HgbA1c: Stable hgbA1c at 7.4. maintain metformin. Advised to make dietary changes (DASH) Stable renal and liver function Abnormal lipid panel: elevated Triglycerides and LDL. with a diagnosis of DM and HTN, your have a high risk for cardiovascular disease. I recommend starting pravastatin. Normal DM foot exam Does not check glucose at home. Due for urine microalbumin 10/2020 Dental cleaning done every 21months Upcoming eye exam 10/2020 F/up in 101months

## 2020-08-25 NOTE — Assessment & Plan Note (Signed)
Repeat TSH

## 2020-08-25 NOTE — Progress Notes (Signed)
Subjective:    Patient ID: Laura Miller, female    DOB: Nov 21, 1985, 35 y.o.   MRN: [id number]  Patient presents today for CPE and eval of chronic conditions  HPI HTN (hypertension), benign BP at goal with amlodipine and lisinopril BP Readings from Last 3 Encounters:  08/25/20 120/86  05/24/20 120/76  03/05/20 (!) 148/72   Maintain medications  Thyromegaly Repeat TSH  DM (diabetes mellitus) (HCC) Repeat HgbA1c: Stable hgbA1c at 7.4. maintain metformin. Advised to make dietary changes (DASH) Stable renal and liver function Abnormal lipid panel: elevated Triglycerides and LDL. with a diagnosis of DM and HTN, your have a high risk for cardiovascular disease. I recommend starting pravastatin. Normal DM foot exam Does not check glucose at home. Due for urine microalbumin 10/2020 Dental cleaning done every 21months Upcoming eye exam 10/2020 F/up in 76months  Morbid obesity (HCC) Discuss the need for daily exercise, heart healthy diet and decrease calorie intake. Advised about her risk for cardiovascular disease and complications from DM. She verbalized understanding.  Wt Readings from Last 3 Encounters:  08/25/20 221 lb (100.2 kg)  05/24/20 211 lb 9.6 oz (96 kg)  03/05/20 209 lb (94.8 kg)     Sexual History (orientation,birth control, marital status, STD):breast and pelvic exam completed by GYN  Depression/Suicide: Depression screen [facility] 2/9 08/25/2020 11/24/2019 04/24/2017 04/07/2013  Decreased Interest 0 0 0 0  Down, Depressed, Hopeless 0 0 0 0  PHQ - 2 Score 0 0 0 0  Altered sleeping - - 0 -  Tired, decreased energy - - 1 -  Change in appetite - - 1 -  Feeling bad or failure about yourself  - - 0 -  Trouble concentrating - - 0 -  Moving slowly or fidgety/restless - - 0 -  Suicidal thoughts - - 0 -  PHQ-9 Score - - 2 -  Difficult doing work/chores - - Not difficult at all -   Vision:up to date  Dental:up to date  Immunizations: (TDAP, Hep C screen, Pneumovax,  Influenza, zoster)  Health Maintenance  Topic Date Due  . COVID-19 Vaccine (1) Never done  . Complete foot exam   11/23/2020  . Eye exam for diabetics  11/25/2020  . Hemoglobin A1C  02/22/2021  . Pap Smear  12/08/2021  . Tetanus Vaccine  05/19/2027  . Flu Shot  Completed  . Pneumococcal vaccine  Completed  .  Hepatitis C: One time screening is recommended by Center for Disease Control  (CDC) for  adults born from [birth year] through 1965.   Completed  . HIV Screening  Completed   Diet:regular Exercise: none Weight:  Wt Readings from Last 3 Encounters:  08/25/20 221 lb (100.2 kg)  05/24/20 211 lb 9.6 oz (96 kg)  03/05/20 209 lb (94.8 kg)   Fall Risk: Fall Risk  05/24/2020  Falls in the past year? 0  Number falls in past yr: 0  Injury with Fall? 0   Medications and allergies reviewed with patient and updated if appropriate.  Patient Active Problem List   Diagnosis Date Noted  . Morbid obesity (HCC) 08/25/2020  . DM (diabetes mellitus) (HCC) 11/24/2019  . Microalbuminuria 12/17/2017  . 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 HTN (hypertension), benign 04/07/2013  . Thyromegaly 04/07/2013  . Routine general medical examination at a health care facility 10/01/2012    Current Outpatient Medications on File Prior to Visit  Medication Sig Dispense Refill  . amLODipine (NORVASC) 5 MG tablet TAKE  1 TABLET(5 MG) BY MOUTH DAILY 90 tablet 3  . fluticasone (FLONASE) 50 MCG/ACT nasal spray Place 1 spray into both nostrils daily.    Marland Kitchen glucose blood (ONETOUCH VERIO) test strip USE TO CHECK BLOOD SUGAR ONCE A DAY 100 each 3  . lisinopril (ZESTRIL) 2.5 MG tablet TAKE 1 TABLET(2.5 MG) BY MOUTH DAILY 90 tablet 3  . metFORMIN (GLUCOPHAGE-XR) 500 MG 24 hr tablet Take 1 tablet (500 mg total) by mouth daily with breakfast. 90 tablet 3  . norgestimate-ethinyl estradiol (ORTHO-CYCLEN) 0.25-35 MG-MCG tablet Take 1 tablet by mouth daily. 3 tablet 4  . OneTouch Delica  Lancets 33G MISC USE TO CHECK BLOOD SUGAR DAILY 100 each 3  . Probiotic Product (PROBIOTIC DAILY) CAPS Take 1 capsule by mouth daily.     No current facility-administered medications on file prior to visit.    Past Medical History:  Diagnosis Date  . Chicken pox   . Diabetes mellitus type 2 in obese (HCC)   . History of jaundice    infant  . HTN (hypertension), benign     Past Surgical History:  Procedure Laterality Date  . NO PAST SURGERIES      Social History   Socioeconomic History  . Marital status: Single    Spouse name: Not on file  . Number of children: Not on file  . Years of education: Not on file  . Highest education level: Not on file  Occupational History  . Occupation: Production designer, theatre/television/film  Tobacco Use  . Smoking status: Never Smoker  . Smokeless tobacco: Never Used  Vaping Use  . Vaping Use: Never used  Substance and Sexual Activity  . Alcohol use: No    Alcohol/week: 0.0 standard drinks  . Drug use: No  . Sexual activity: Yes    Partners: Male    Birth control/protection: Pill  Other Topics Concern  . 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 Diabetes Father   . Diabetes Maternal Grandmother   . Hyperlipidemia Maternal Grandmother   . Hyperlipidemia Maternal Grandfather   . Diabetes Paternal Grandmother   . Diabetes Paternal Grandfather   . Diabetes Mother   . Diabetes Sister        Review of Systems  Constitutional: Negative for fever, malaise/fatigue and weight loss.  HENT: Negative for congestion and sore throat.   Eyes:        Negative for visual changes  Respiratory: Negative for cough and shortness of breath.   Cardiovascular: Negative for chest pain, palpitations and leg swelling.  Gastrointestinal: Negative for blood in stool, constipation, diarrhea and heartburn.  Genitourinary: Negative for dysuria, frequency and urgency.  Musculoskeletal: Negative for falls, joint pain and myalgias.  Skin: Negative for rash.  Neurological: Negative for dizziness, sensory change and headaches.  Endo/Heme/Allergies: Does not bruise/bleed easily.  Psychiatric/Behavioral: Negative for depression, substance abuse and suicidal ideas. The patient is not nervous/anxious.    Objective:   Vitals:   08/25/20 0814  BP: 120/86  Pulse: (!) 110  Temp: (!) 97.4 F (36.3 C)  SpO2: 97%   Body mass index is 35.67 kg/m.  Physical Examination:  Physical Exam Vitals reviewed.  Constitutional:      General: She is not in acute distress.    Appearance: She is obese.  HENT:     Right Ear: Tympanic membrane, ear canal and external ear normal.     Left Ear: Tympanic membrane, ear canal and external ear normal.     Mouth/Throat:     Mouth: Oropharynx is clear and moist.  Eyes:     General: No scleral icterus.    Extraocular Movements: Extraocular movements intact and EOM normal.     Conjunctiva/sclera: Conjunctivae normal.  Neck:     Thyroid: No thyromegaly.  Cardiovascular:     Rate and Rhythm: Normal rate.     Pulses: Normal pulses and intact distal pulses.     Heart sounds: Normal heart sounds.  Pulmonary:     Effort: Pulmonary effort is normal.     Breath sounds: Normal breath sounds.  Chest:     Chest wall: No tenderness.  Abdominal:     General: Bowel sounds are normal. There is no distension.     Palpations: Abdomen is soft.     Tenderness: There is no abdominal tenderness.  Genitourinary:    Comments: Deferred breast and pelvic exam to GYN Musculoskeletal:        General: No tenderness or edema. Normal  range of motion.     Cervical back: Normal range of motion and neck supple.     Right lower leg: No edema.     Left lower leg: No edema.  Lymphadenopathy:     Cervical: No cervical adenopathy.  Skin:    General: Skin is warm and dry.  Neurological:     Mental Status: She is alert and oriented to person, place, and time.  Psychiatric:        Mood and Affect: Mood normal.        Behavior: Behavior normal.        Thought Content: Thought content normal.        Judgment: Judgment normal.    ASSESSMENT and PLAN: This visit occurred during the SARS-CoV-2 public health emergency.  Safety protocols were in place, including screening questions prior to the visit, additional usage of staff PPE, and extensive cleaning of exam room while observing appropriate contact time as indicated for disinfecting solutions.   Laura Miller was seen today for annual exam.  Diagnoses and all orders for this visit:  Encounter for preventative adult health care exam with abnormal findings -     CBC with Differential/Platelet -     Comprehensive metabolic panel -     Lipid panel  Type 2 diabetes mellitus with hyperglycemia, without long-term current use of insulin (HCC) -     Hemoglobin A1c  HTN (hypertension), benign  Thyromegaly -     TSH  Encounter for lipid screening for cardiovascular disease -     Lipid panel  Class 2 severe obesity due to excess calories with serious comorbidity and body mass index (BMI) of 35.0 to 35.9 in adult [facility](HCC)      Problem List Items Addressed This Visit      Cardiovascular and Mediastinum   HTN (hypertension), benign    BP at goal with amlodipine and lisinopril BP Readings from Last 3 Encounters:  08/25/20 120/86  05/24/20 120/76  03/05/20 (!) 148/72   Maintain medications  Endocrine   DM (diabetes mellitus) (HCC)    Repeat HgbA1c: Stable hgbA1c at 7.4. maintain metformin. Advised to make dietary changes (DASH) Stable renal and liver  function Abnormal lipid panel: elevated Triglycerides and LDL. with a diagnosis of DM and HTN, your have a high risk for cardiovascular disease. I recommend starting pravastatin. Normal DM foot exam Does not check glucose at home. Due for urine microalbumin 10/2020 Dental cleaning done every 67months Upcoming eye exam 10/2020 F/up in 30months      Relevant Orders   Hemoglobin A1c (Completed)   Thyromegaly    Repeat TSH      Relevant Orders   TSH (Completed)     Other   Morbid obesity (HCC)    Discuss the need for daily exercise, heart healthy diet and decrease calorie intake. Advised about her risk for cardiovascular disease and complications from DM. She verbalized understanding.  Wt Readings from Last 3 Encounters:  08/25/20 221 lb (100.2 kg)  05/24/20 211 lb 9.6 oz (96 kg)  03/05/20 209 lb (94.8 kg)          Other Visit Diagnoses    Encounter for preventative adult health care exam with abnormal findings    -  Primary   Relevant Orders   CBC with Differential/Platelet (Completed)   Comprehensive metabolic panel (Completed)   Lipid panel (Completed)   Encounter for lipid screening for cardiovascular disease       Relevant Orders   Lipid panel (Completed)      Follow up: Return in about 6 months (around 02/22/2021) for DM and HTN.  [name], NP

## 2020-08-25 NOTE — Assessment & Plan Note (Signed)
>>  ASSESSMENT AND PLAN FOR TYPE 2 DIABETES MELLITUS WITH HYPERGLYCEMIA, WITHOUT LONG-TERM CURRENT USE OF INSULIN ([facility]) WRITTEN ON 08/25/2020  3:29 PM BY Nazire Fruth LUM, NP  Repeat HgbA1c: Stable hgbA1c at 7.4. maintain metformin . Advised to make dietary changes (DASH) Stable renal and liver function Abnormal lipid panel: elevated Triglycerides and LDL. with a diagnosis of DM and HTN, your have a high risk for cardiovascular disease. I recommend starting pravastatin . Normal DM foot exam Does not check glucose at home. Due for urine microalbumin 10/2020 Dental cleaning done every 6months Upcoming eye exam 10/2020 F/up in 3months

## 2020-08-25 NOTE — Patient Instructions (Signed)
Go to lab for blood draw. Start daily exercise: walking  Diabetes Mellitus and Nutrition, Adult When you have diabetes, or diabetes mellitus, it is very important to have healthy eating habits because your blood sugar (glucose) levels are greatly affected by what you eat and drink. Eating healthy foods in the right amounts, at about the same times every day, can help you:  Control your blood glucose.  Lower your risk of heart disease.  Improve your blood pressure.  Reach or maintain a healthy weight. What can affect my meal plan? Every person with diabetes is different, and each person has different needs for a meal plan. Your health care provider may recommend that you work with a dietitian to make a meal plan that is best for you. Your meal plan may vary depending on factors such as:  The calories you need.  The medicines you take.  Your weight.  Your blood glucose, blood pressure, and cholesterol levels.  Your activity level.  Other health conditions you have, such as heart or kidney disease. How do carbohydrates affect me? Carbohydrates, also called carbs, affect your blood glucose level more than any other type of food. Eating carbs naturally raises the amount of glucose in your blood. Carb counting is a method for keeping track of how many carbs you eat. Counting carbs is important to keep your blood glucose at a healthy level, especially if you use insulin or take certain oral diabetes medicines. It is important to know how many carbs you can safely have in each meal. This is different for every person. Your dietitian can help you calculate how many carbs you should have at each meal and for each snack. How does alcohol affect me? Alcohol can cause a sudden decrease in blood glucose (hypoglycemia), especially if you use insulin or take certain oral diabetes medicines. Hypoglycemia can be a life-threatening condition. Symptoms of hypoglycemia, such as sleepiness,  dizziness, and confusion, are similar to symptoms of having too much alcohol.  Do not drink alcohol if: ? Your health care provider tells you not to drink. ? You are pregnant, may be pregnant, or are planning to become pregnant.  If you drink alcohol: ? Do not drink on an empty stomach. ? Limit how much you use to:  0-1 drink a day for women.  0-2 drinks a day for men. ? Be aware of how much alcohol is in your drink. In the [place] one drink equals one 12 oz bottle of beer (355 mL), one 5 oz glass of wine (148 mL), or one 1 oz glass of hard liquor (44 mL). ? Keep yourself hydrated with water, diet soda, or unsweetened iced tea.  Keep in mind that regular soda, juice, and other mixers may contain a lot of sugar and must be counted as carbs. What are tips for following this plan? Reading food labels  Start by checking the serving size on the "Nutrition Facts" label of packaged foods and drinks. The amount of calories, carbs, fats, and other nutrients listed on the label is based on one serving of the item. Many items contain more than one serving per package.  Check the total grams (g) of carbs in one serving. You can calculate the number of servings of carbs in one serving by dividing the total carbs by 15. For example, if a food has 30 g of total carbs per serving, it would be equal to 2 servings of carbs.  Check the number of grams (g) of  saturated fats and trans fats in one serving. Choose foods that have a low amount or none of these fats.  Check the number of milligrams (mg) of salt (sodium) in one serving. Most people should limit total sodium intake to less than 2,300 mg per day.  Always check the nutrition information of foods labeled as "low-fat" or "nonfat." These foods may be higher in added sugar or refined carbs and should be avoided.  Talk to your dietitian to identify your daily goals for nutrients listed on the label. Shopping  Avoid buying canned, pre-made, or  processed foods. These foods tend to be high in fat, sodium, and added sugar.  Shop around the outside edge of the grocery store. This is where you will most often find fresh fruits and vegetables, bulk grains, fresh meats, and fresh dairy. Cooking  Use low-heat cooking methods, such as baking, instead of high-heat cooking methods like deep frying.  Cook using healthy oils, such as olive, canola, or sunflower oil.  Avoid cooking with butter, cream, or high-fat meats. Meal planning  Eat meals and snacks regularly, preferably at the same times every day. Avoid going long periods of time without eating.  Eat foods that are high in fiber, such as fresh fruits, vegetables, beans, and whole grains. Talk with your dietitian about how many servings of carbs you can eat at each meal.  Eat 4-6 oz (112-168 g) of lean protein each day, such as lean meat, chicken, fish, eggs, or tofu. One ounce (oz) of lean protein is equal to: ? 1 oz (28 g) of meat, chicken, or fish. ? 1 egg. ?  cup (62 g) of tofu.  Eat some foods each day that contain healthy fats, such as avocado, nuts, seeds, and fish.   What foods should I eat? Fruits Berries. Apples. Oranges. Peaches. Apricots. Plums. Grapes. Mango. Papaya. Pomegranate. Kiwi. Cherries. Vegetables Lettuce. Spinach. Leafy greens, including kale, chard, collard greens, and mustard greens. Beets. Cauliflower. Cabbage. Broccoli. Carrots. Green beans. Tomatoes. Peppers. Onions. Cucumbers. Brussels sprouts. Grains Whole grains, such as whole-wheat or whole-grain bread, crackers, tortillas, cereal, and pasta. Unsweetened oatmeal. Quinoa. Brown or wild rice. Meats and other proteins Seafood. Poultry without skin. Lean cuts of poultry and beef. Tofu. Nuts. Seeds. Dairy Low-fat or fat-free dairy products such as milk, yogurt, and cheese. The items listed above may not be a complete list of foods and beverages you can eat. Contact a dietitian for more  information. What foods should I avoid? Fruits Fruits canned with syrup. Vegetables Canned vegetables. Frozen vegetables with butter or cream sauce. Grains Refined white flour and flour products such as bread, pasta, snack foods, and cereals. Avoid all processed foods. Meats and other proteins Fatty cuts of meat. Poultry with skin. Breaded or fried meats. Processed meat. Avoid saturated fats. Dairy Full-fat yogurt, cheese, or milk. Beverages Sweetened drinks, such as soda or iced tea. The items listed above may not be a complete list of foods and beverages you should avoid. Contact a dietitian for more information. Questions to ask a health care provider  Do I need to meet with a diabetes educator?  Do I need to meet with a dietitian?  What number can I call if I have questions?  When are the best times to check my blood glucose? Where to find more information:  American Diabetes Association: diabetes.org  Academy of Nutrition and Dietetics: www.eatright.[employer] of Diabetes and Digestive and Kidney Diseases: [employer]  Association of Diabetes Care and  Education Specialists: www.diabeteseducator.org Summary  It is important to have healthy eating habits because your blood sugar (glucose) levels are greatly affected by what you eat and drink.  A healthy meal plan will help you control your blood glucose and maintain a healthy lifestyle.  Your health care provider may recommend that you work with a dietitian to make a meal plan that is best for you.  Keep in mind that carbohydrates (carbs) and alcohol have immediate effects on your blood glucose levels. It is important to count carbs and to use alcohol carefully. This information is not intended to replace advice given to you by your health care provider. Make sure you discuss any questions you have with your health care provider. Document Revised: 05/27/2019 Document Reviewed: 05/27/2019 Elsevier  Patient Education  2021 [employer].

## 2020-08-25 NOTE — Assessment & Plan Note (Signed)
Discuss the need for daily exercise, heart healthy diet and decrease calorie intake. Advised about her risk for cardiovascular disease and complications from DM. She verbalized understanding.  Wt Readings from Last 3 Encounters:  08/25/20 221 lb (100.2 kg)  05/24/20 211 lb 9.6 oz (96 kg)  03/05/20 209 lb (94.8 kg)

## 2020-08-26 ENCOUNTER — Other Ambulatory Visit: Payer: Self-pay | Admitting: Nurse Practitioner

## 2020-08-26 ENCOUNTER — Encounter: Payer: Self-pay | Admitting: Nurse Practitioner

## 2020-08-26 DIAGNOSIS — E1169 Type 2 diabetes mellitus with other specified complication: Secondary | ICD-10-CM | POA: Insufficient documentation

## 2020-08-26 DIAGNOSIS — E7800 Pure hypercholesterolemia, unspecified: Secondary | ICD-10-CM

## 2020-08-26 HISTORY — DX: Pure hypercholesterolemia, unspecified: E78.00

## 2020-08-26 MED ORDER — PRAVASTATIN SODIUM 20 MG PO TABS: 20.0000 mg | ORAL_TABLET | Freq: Every day | ORAL | 3 refills | Status: DC

## 2020-08-26 NOTE — Progress Notes (Signed)
Abnormal lipid panel: elevated Triglycerides and LDL. with a diagnosis of DM and HTN, your have a high risk for cardiovascular disease. I recommend starting pravastatin.

## 2020-09-14 ENCOUNTER — Telehealth: Payer: Self-pay | Admitting: Nurse Practitioner

## 2020-09-14 NOTE — Telephone Encounter (Signed)
Stop medication and take benadryl

## 2020-09-14 NOTE — Telephone Encounter (Signed)
Patient notified and verbalized understanding. 

## 2020-09-14 NOTE — Telephone Encounter (Signed)
Pt called stating she has red bump on arms and neck that she is thinking is from new medication pravastatin (PRAVACHOL) 20 MG tablet .She says they itch but no pain/burning. Please call back [phone].

## 2020-11-22 ENCOUNTER — Ambulatory Visit: Payer: PRIVATE HEALTH INSURANCE | Admitting: Nurse Practitioner

## 2020-11-22 ENCOUNTER — Other Ambulatory Visit: Payer: Self-pay | Admitting: Nurse Practitioner

## 2020-11-22 DIAGNOSIS — I10 Essential (primary) hypertension: Secondary | ICD-10-CM

## 2020-11-25 ENCOUNTER — Other Ambulatory Visit: Payer: Self-pay

## 2020-11-26 ENCOUNTER — Encounter: Payer: Self-pay | Admitting: Nurse Practitioner

## 2020-11-26 ENCOUNTER — Ambulatory Visit: Payer: PRIVATE HEALTH INSURANCE | Admitting: Nurse Practitioner

## 2020-11-26 VITALS — BP 132/88 | HR 104 | Temp 97.8°F | Ht 66.0 in | Wt 215.6 lb

## 2020-11-26 DIAGNOSIS — E1165 Type 2 diabetes mellitus with hyperglycemia: Secondary | ICD-10-CM

## 2020-11-26 DIAGNOSIS — R809 Proteinuria, unspecified: Secondary | ICD-10-CM | POA: Diagnosis not present

## 2020-11-26 DIAGNOSIS — E7800 Pure hypercholesterolemia, unspecified: Secondary | ICD-10-CM | POA: Diagnosis not present

## 2020-11-26 DIAGNOSIS — I10 Essential (primary) hypertension: Secondary | ICD-10-CM

## 2020-11-26 LAB — POCT GLYCOSYLATED HEMOGLOBIN (HGB A1C): Hemoglobin A1C: 8.3 % — AB (ref 4.0–5.6)

## 2020-11-26 MED ORDER — ONETOUCH VERIO VI STRP: ORAL_STRIP | 3 refills | Status: DC

## 2020-11-26 MED ORDER — RYBELSUS 3 MG PO TABS
1.0000 | ORAL_TABLET | Freq: Every day | ORAL | 5 refills | Status: DC
Start: 2020-11-26 — End: 2021-07-27

## 2020-11-26 MED ORDER — EZETIMIBE 10 MG PO TABS: 10.0000 mg | ORAL_TABLET | Freq: Every day | ORAL | 3 refills | Status: DC

## 2020-11-26 MED ORDER — METFORMIN HCL ER 750 MG PO TB24: 750.0000 mg | ORAL_TABLET | Freq: Every day | ORAL | 3 refills | Status: DC

## 2020-11-26 NOTE — Patient Instructions (Signed)
Return to lab in 52month for blood draw and urine collection  Increase metformin to 750mg  daily. New rx sent Start rybelsus 3mg  daily You will be contacted to schedule appt with nutritionist. Maintain daily exercise Have eye exam report sent to me once completed.  Start zetia for cholesterol.  Semaglutide Oral Tablets What is this medicine? SEMAGLUTIDE (Sem a GLOO tide) controls blood sugar in people with type 2 diabetes. It is used with lifestyle changes like diet and exercise. This medicine may be used for other purposes; ask your health care provider or pharmacist if you have questions. COMMON BRAND NAME(S): Rybelsus What should I tell my health care provider before I take this medicine? They need to know if you have any of these conditions:  endocrine tumors (MEN 2) or if someone in your family had these tumors  eye disease  history of pancreatitis  kidney disease  stomach or intestine problems  thyroid cancer or if someone in your family had thyroid cancer  vision problems  an unusual or allergic reaction to semaglutide, other medicines, foods, dyes, or preservatives  pregnant or trying to get pregnant  breast-feeding How should I use this medicine? Take this medicine by mouth. Take it as directed on the prescription label at the same time every day. Take the dose right after waking up. Do not eat or drink anything before taking it. Do not take it with any other drink except a glass of plain water that is less than 4 ounces (less than 120 mL). Do not cut, crush or chew this medicine. Swallow the tablets whole. After taking it, do not eat breakfast, drink, or take any other medicines or vitamins for at least 30 minutes. Keep taking it unless your health care provider tells you to stop. A special MedGuide will be given to you by the pharmacist with each prescription and refill. Be sure to read this information carefully each time. Talk to your health care provider about the  use of this medicine in children. Special care may be needed. Overdosage: If you think you have taken too much of this medicine contact a poison control center or emergency room at once. NOTE: This medicine is only for you. Do not share this medicine with others. What if I miss a dose? If you miss a dose, skip it. Take your next dose at the normal time. Do not take extra or 2 doses at the same time to make up for the missed dose. What may interact with this medicine? What may interact with this medicine?  aminophylline  carbamazepine  cyclosporine  digoxin  levothyroxine  other medicines for diabetes  phenytoin  tacrolimus  theophylline  warfarin Many medications may cause changes in blood sugar, these include:  alcohol containing beverages  antiviral medicines for HIV or AIDS  aspirin and aspirin-like drugs  certain medicines for blood pressure, heart disease, irregular heart beat  chromium  diuretics  female hormones, such as estrogens or progestins, birth control pills  fenofibrate  gemfibrozil  isoniazid  lanreotide  female hormones or anabolic steroids  MAOIs like Carbex, Eldepryl, Marplan, Nardil, and Parnate  medicines for weight loss  medicines for allergies, asthma, cold, or cough  medicines for depression, anxiety, or psychotic disturbances  niacin  nicotine  NSAIDs, medicines for pain and inflammation, like ibuprofen or naproxen  octreotide  pasireotide  pentamidine  phenytoin  probenecid  quinolone antibiotics such as ciprofloxacin, levofloxacin, ofloxacin  some herbal dietary supplements  steroid medicines such as prednisone  or cortisone  sulfamethoxazole; trimethoprim  thyroid hormones Some medications can hide the warning symptoms of low blood sugar (hypoglycemia). You may need to monitor your blood sugar more closely if you are taking one of these medications. These include:  beta-blockers, often used for high blood  pressure or heart problems (examples include atenolol, metoprolol, propranolol)  clonidine  guanethidine  reserpine This list may not describe all possible interactions. Give your health care provider a list of all the medicines, herbs, non-prescription drugs, or dietary supplements you use. Also tell them if you smoke, drink alcohol, or use illegal drugs. Some items may interact with your medicine. What should I watch for while using this medicine? Visit your health care provider for regular checks on your progress. Check with your health care provider if you have severe diarrhea, nausea, and vomiting, or if you sweat a lot. The loss of too much body fluid may make it dangerous for you to take this medicine. A test called the HbA1C (A1C) will be monitored. This is a simple blood test. It measures your blood sugar control over the last 2 to 3 months. You will receive this test every 3 to 6 months. Learn how to check your blood sugar. Learn the symptoms of low and high blood sugar and how to manage them. Always carry a quick-source of sugar with you in case you have symptoms of low blood sugar. Examples include hard sugar candy or glucose tablets. Make sure others know that you can choke if you eat or drink when you develop serious symptoms of low blood sugar, such as seizures or unconsciousness. Get medical help at once. Tell your health care provider if you have high blood sugar. You might need to change the dose of your medicine. If you are sick or exercising more than usual, you might need to change the dose of your medicine. Do not skip meals. Ask your health care provider if you should avoid alcohol. Many nonprescription cough and cold products contain sugar or alcohol. These can affect blood sugar. Wear a medical ID bracelet or chain. Carry a card that describes your condition. List the medicines and doses you take on the card. Do not become pregnant while taking this medicine. Women should  inform their health care provider if they wish to become pregnant or think they might be pregnant. There is a potential for serious side effects to an unborn child. Talk to your health care provider for more information. Do not breast-feed an infant while taking this medicine. What side effects may I notice from receiving this medicine? Side effects that you should report to your doctor or health care provider as soon as possible:  allergic reactions (skin rash, itching or hives; swelling of the face, lips, or tongue)  changes in vision  diarrhea that continues or is severe  infection (fever, chills, cough, sore throat, pain or trouble passing urine)  kidney injury (trouble passing urine or change in the amount of urine)  low blood sugar (feeling anxious; confusion; dizziness; increased hunger; unusually weak or tired; increased sweating; shakiness; cold, clammy skin; irritable; headache; blurred vision; fast heartbeat; loss of consciousness)  lump or swelling on the neck  painful or difficulty swallowing  severe nausea  severe or unusual stomach pain  trouble breathing  vomiting Side effects that usually do not require medical attention (report these to your doctor or health care provider if they continue or are bothersome):  constipation  diarrhea  nausea  upset stomach This  list may not describe all possible side effects. Call your doctor for medical advice about side effects. You may report side effects to FDA at 1-800-FDA-1088. Where should I keep my medicine? Keep out of the reach of children and pets. Store at room temperature between 20 and 25 degrees C (68 and 77 degrees F). Keep this medicine in the original container. Protect from moisture. Keep the container tightly closed. Get rid of any unused medicine after the expiration date. To get rid of medicines that are no longer needed or have expired:  Take the medicine to a medicine take-back program. Check with your  pharmacy or law enforcement to find a location.  If you cannot return the medicine, check the label or package insert to see if the medicine should be thrown out in the garbage or flushed down the toilet. If you are not sure, ask your health care provider. If it is safe to put it in the trash, take the medicine out of the container. Mix the medicine with cat litter, dirt, coffee grounds, or other unwanted substance. Seal the mixture in a bag or container. Put it in the trash. NOTE: This sheet is a summary. It may not cover all possible information. If you have questions about this medicine, talk to your doctor, pharmacist, or health care provider.  2021 Elsevier/Gold Standard (2020-05-17 15:08:33)

## 2020-11-26 NOTE — Assessment & Plan Note (Signed)
>>  ASSESSMENT AND PLAN FOR TYPE 2 DIABETES MELLITUS WITH HYPERGLYCEMIA, WITHOUT LONG-TERM CURRENT USE OF INSULIN ([facility]) WRITTEN ON 11/26/2020  2:42 PM BY Jeromiah Ohalloran LUM, NP  Uncontrolled, today's hgbA1c of 8.3% She admits to diet indiscretion and lack of exercise due to work schedule. She does not check glucose at home. She agreed to nutrition referral. Order entered She has upcoming appt with ophthalmology. Increase metformin  to 750mg  daily. New rx sent Start rybelsus  3mg  daily Maintain daily exercise Repeat hepatic panel and lipase in 2month F/up with me in 3months

## 2020-11-26 NOTE — Assessment & Plan Note (Signed)
Developed hives with pravastatin zetia rx sent Advised about importance of heart health diet and daily exercise.

## 2020-11-26 NOTE — Assessment & Plan Note (Addendum)
Uncontrolled, today's hgbA1c of 8.3% She admits to diet indiscretion and lack of exercise due to work schedule. She does not check glucose at home. She agreed to nutrition referral. Order entered She has upcoming appt with ophthalmology. Increase metformin to 750mg  daily. New rx sent Start rybelsus 3mg  daily Maintain daily exercise Repeat hepatic panel and lipase in 39month F/up with me in 20months

## 2020-11-26 NOTE — Progress Notes (Signed)
Subjective:  Patient ID: Laura Miller, female    DOB: [birth year]  Age: 35 y.o. MRN: [id number]  CC: Follow-up (6 month f/u on DM and HTN. )   HPI  DM (diabetes mellitus) (HCC) Uncontrolled, today's hgbA1c of 8.3% She admits to diet indiscretion and lack of exercise due to work schedule. She does not check glucose at home. She agreed to nutrition referral. Order entered She has upcoming appt with ophthalmology. Increase metformin to 750mg  daily. New rx sent Start rybelsus 3mg  daily Maintain daily exercise Repeat hepatic panel and lipase in 21month F/up with me in 15months  HTN (hypertension), benign BP at goal with lisinopril and amlodipine BP Readings from Last 3 Encounters:  11/26/20 132/88  08/25/20 120/86  05/24/20 120/76   Refill sent  Pure hypercholesterolemia Developed hives with pravastatin zetia rx sent Advised about importance of heart health diet and daily exercise.  BP Readings from Last 3 Encounters:  11/26/20 132/88  08/25/20 120/86  05/24/20 120/76   Wt Readings from Last 3 Encounters:  11/26/20 215 lb 9.6 oz (97.8 kg)  08/25/20 221 lb (100.2 kg)  05/24/20 211 lb 9.6 oz (96 kg)   Reviewed past Medical, Social and Family history today.  Outpatient Medications Prior to Visit  Medication Sig Dispense Refill  . amLODipine (NORVASC) 5 MG tablet TAKE 1 TABLET(5 MG) BY MOUTH DAILY 90 tablet 3  . fluticasone (FLONASE) 50 MCG/ACT nasal spray Place 1 spray into both nostrils daily.    08/27/20 lisinopril (ZESTRIL) 2.5 MG tablet TAKE 1 TABLET(2.5 MG) BY MOUTH DAILY 90 tablet 3  . norgestimate-ethinyl estradiol (ORTHO-CYCLEN) 0.25-35 MG-MCG tablet Take 1 tablet by mouth daily. 3 tablet 4  . OneTouch Delica Lancets 33G MISC USE TO CHECK BLOOD SUGAR DAILY 100 each 3  . Probiotic Product (PROBIOTIC DAILY) CAPS Take 1 capsule by mouth daily.    Marland Kitchen glucose blood (ONETOUCH VERIO) test strip USE TO CHECK BLOOD SUGAR ONCE A DAY 100 each 3  . metFORMIN (GLUCOPHAGE-XR) 500  MG 24 hr tablet Take 1 tablet (500 mg total) by mouth daily with breakfast. 90 tablet 3  . pravastatin (PRAVACHOL) 20 MG tablet Take 1 tablet (20 mg total) by mouth at bedtime. (Patient not taking: Reported on 11/26/2020) 90 tablet 3   No facility-administered medications prior to visit.    ROS See HPI  Objective:  BP 132/88 (BP Location: Left Arm, Patient Position: Sitting, Cuff Size: Large)   Pulse (!) 104   Temp 97.8 F (36.6 C) (Temporal)   Ht 5\' 6"  (1.676 m)   Wt 215 lb 9.6 oz (97.8 kg)   SpO2 100%   BMI 34.80 kg/m   Physical Exam  Assessment & Plan:  This visit occurred during the SARS-CoV-2 public health emergency.  Safety protocols were in place, including screening questions prior to the visit, additional usage of staff PPE, and extensive cleaning of exam room while observing appropriate contact time as indicated for disinfecting solutions.   Laura Miller was seen today for follow-up.  Diagnoses and all orders for this visit:  Type 2 diabetes mellitus with hyperglycemia, without long-term current use of insulin (HCC) -     POCT glycosylated hemoglobin (Hb A1C) -     Cancel: Microalbumin / creatinine urine ratio -     ezetimibe (ZETIA) 10 MG tablet; Take 1 tablet (10 mg total) by mouth daily. -     Semaglutide (RYBELSUS) 3 MG TABS; Take 1 tablet by mouth daily. -     metFORMIN (GLUCOPHAGE-XR)  750 MG 24 hr tablet; Take 1 tablet (750 mg total) by mouth daily with breakfast. -     Referral to Nutrition and Diabetes Services -     Hepatic function panel; Future -     Lipase; Future -     Microalbumin / creatinine urine ratio; Future -     glucose blood (ONETOUCH VERIO) test strip; USE TO CHECK BLOOD SUGAR ONCE A DAY  HTN (hypertension), benign  Pure hypercholesterolemia -     ezetimibe (ZETIA) 10 MG tablet; Take 1 tablet (10 mg total) by mouth daily. -     Hepatic function panel; Future  Microalbuminuria -     metFORMIN (GLUCOPHAGE-XR) 750 MG 24 hr tablet; Take 1 tablet  (750 mg total) by mouth daily with breakfast.   Problem List Items Addressed This Visit      Cardiovascular and Mediastinum   HTN (hypertension), benign    BP at goal with lisinopril and amlodipine BP Readings from Last 3 Encounters:  11/26/20 132/88  08/25/20 120/86  05/24/20 120/76   Refill sent      Relevant Medications   ezetimibe (ZETIA) 10 MG tablet     Endocrine   DM (diabetes mellitus) (HCC) - Primary    Uncontrolled, today's hgbA1c of 8.3% She admits to diet indiscretion and lack of exercise due to work schedule. She does not check glucose at home. She agreed to nutrition referral. Order entered She has upcoming appt with ophthalmology. Increase metformin to 750mg  daily. New rx sent Start rybelsus 3mg  daily Maintain daily exercise Repeat hepatic panel and lipase in 25month F/up with me in 80months      Relevant Medications   ezetimibe (ZETIA) 10 MG tablet   Semaglutide (RYBELSUS) 3 MG TABS   metFORMIN (GLUCOPHAGE-XR) 750 MG 24 hr tablet   glucose blood (ONETOUCH VERIO) test strip   Other Relevant Orders   POCT glycosylated hemoglobin (Hb A1C) (Completed)   Referral to Nutrition and Diabetes Services   Hepatic function panel   Lipase   Microalbumin / creatinine urine ratio     Other   Microalbuminuria   Relevant Medications   metFORMIN (GLUCOPHAGE-XR) 750 MG 24 hr tablet   Pure hypercholesterolemia    Developed hives with pravastatin zetia rx sent Advised about importance of heart health diet and daily exercise.      Relevant Medications   ezetimibe (ZETIA) 10 MG tablet   Other Relevant Orders   Hepatic function panel      Follow-up: Return in about 3 months (around 02/26/2021) for DM and HTN, hyperlipidemia (fasting).  1month, NP

## 2020-11-26 NOTE — Assessment & Plan Note (Signed)
BP at goal with lisinopril and amlodipine BP Readings from Last 3 Encounters:  11/26/20 132/88  08/25/20 120/86  05/24/20 120/76   Refill sent

## 2020-12-08 ENCOUNTER — Encounter: Payer: Self-pay | Admitting: Nurse Practitioner

## 2020-12-27 ENCOUNTER — Other Ambulatory Visit: Payer: PRIVATE HEALTH INSURANCE

## 2021-01-06 ENCOUNTER — Other Ambulatory Visit (INDEPENDENT_AMBULATORY_CARE_PROVIDER_SITE_OTHER): Payer: No Typology Code available for payment source

## 2021-01-06 ENCOUNTER — Other Ambulatory Visit: Payer: Self-pay

## 2021-01-06 DIAGNOSIS — E1165 Type 2 diabetes mellitus with hyperglycemia: Secondary | ICD-10-CM | POA: Diagnosis not present

## 2021-01-06 DIAGNOSIS — E7800 Pure hypercholesterolemia, unspecified: Secondary | ICD-10-CM

## 2021-01-06 LAB — HEPATIC FUNCTION PANEL
ALT: 15 U/L (ref 0–35)
AST: 18 U/L (ref 0–37)
Albumin: 4.2 g/dL (ref 3.5–5.2)
Alkaline Phosphatase: 49 U/L (ref 39–117)
Bilirubin, Direct: 0.1 mg/dL (ref 0.0–0.3)
Total Bilirubin: 0.3 mg/dL (ref 0.2–1.2)
Total Protein: 7.5 g/dL (ref 6.0–8.3)

## 2021-01-06 LAB — MICROALBUMIN / CREATININE URINE RATIO
Creatinine,U: 159.2 mg/dL
Microalb Creat Ratio: 1.6 mg/g (ref 0.0–30.0)
Microalb, Ur: 2.6 mg/dL — ABNORMAL HIGH (ref 0.0–1.9)

## 2021-01-06 LAB — LIPASE: Lipase: 37 U/L (ref 11.0–59.0)

## 2021-01-06 NOTE — Progress Notes (Signed)
Per orders of [name] pt is here for labs pt tolerated draw well.

## 2021-01-14 ENCOUNTER — Encounter: Payer: Self-pay | Admitting: Registered"

## 2021-01-14 ENCOUNTER — Encounter: Payer: No Typology Code available for payment source | Attending: Nurse Practitioner | Admitting: Registered"

## 2021-01-14 ENCOUNTER — Other Ambulatory Visit: Payer: Self-pay

## 2021-01-14 DIAGNOSIS — E1165 Type 2 diabetes mellitus with hyperglycemia: Secondary | ICD-10-CM | POA: Insufficient documentation

## 2021-01-14 NOTE — Progress Notes (Signed)
Diabetes Self-Management Education  Visit Type: First/Initial  Appt. Start Time: 0800 Appt. End Time: 0910  01/14/2021  [name], identified by name and date of birth, is a 35 y.o. female with a diagnosis of Diabetes: Type 2.   ASSESSMENT  Height 5\' 5"  (1.651 m), weight 218 lb 14.4 oz (99.3 kg). Body mass index is 36.43 kg/m.  A1c 8.3% Metformin 750 mg; Rybelsus  Sleep: 6 hrs Stress: 4/10  Patient states she tries to make healthy dietary choices such as cooking method of grilled or baked meat, eats (frozen) vegetables often, brown rice, and states she needs to go back to wheat pasta. Pt reports although she likes fruit, doesn't eat a lot due not knowing which ones are high in sugar.   Patient states she stays active and sometimes works in more activity my taking the kids at work out walking on nature trails.  Pt states she typically doesn't drink natural or sugar added beverages. Pt reports last night she really wanted an icee. Although her FBS blood sugar is usually in the controlled range, this morning it was 215 mg/dL.   Diabetes Self-Management Education - 01/14/21 0759       Visit Information   Visit Type First/Initial      Initial Visit   Diabetes Type Type 2    Are you currently following a meal plan? No    Are you taking your medications as prescribed? Yes    Date Diagnosed 11/24/19      Health Coping   How would you rate your overall health? Fair      Psychosocial Assessment   Patient Belief/Attitude about Diabetes Other (comment)   want to manage it better   How often do you need to have someone help you when you read instructions, pamphlets, or other written materials from your doctor or pharmacy? 1 - Never    What is the last grade level you completed in school? graduate degree      Complications   Last HgB A1C per patient/outside source 8.3 %    How often do you check your blood sugar? 1-2 times/day    Fasting Blood glucose range (mg/dL)  11/26/19   161-096;04-540 (today was 215)   Have you had a dilated eye exam in the past 12 months? Yes    Have you had a dental exam in the past 12 months? Yes    Are you checking your feet? Yes    How many days per week are you checking your feet? 7      Dietary Intake   Breakfast skip or egg mcmuffin, diet green tea or water    Snack (morning) none    Lunch left overs or sandwich    Snack (afternoon) pretzel with tea OR applesauce    Dinner 2 smoked sausage with peppers, hot dog bun    Snack (evening) none (last night had icee)    Beverage(s) 4 bottels water, unsweet tea or diet green tea, ocassionally less than a cup of soda      Exercise   Exercise Type Light (walking / raking leaves)    How many days per week to you exercise? 3    How many minutes per day do you exercise? 25    Total minutes per week of exercise 75      Patient Education   Previous Diabetes Education No    Disease state  Definition of diabetes, type 1 and 2, and the diagnosis of diabetes  Nutrition management  Role of diet in the treatment of diabetes and the relationship between the three main macronutrients and blood glucose level;Food label reading, portion sizes and measuring food.;Carbohydrate counting    Physical activity and exercise  Role of exercise on diabetes management, blood pressure control and cardiac health.    Monitoring Identified appropriate SMBG and/or A1C goals.    Acute complications Taught treatment of hypoglycemia - the 15 rule.    Chronic complications Assessed and discussed foot care and prevention of foot problems    Psychosocial adjustment Role of stress on diabetes      Individualized Goals (developed by patient)   Nutrition General guidelines for healthy choices and portions discussed    Physical Activity Exercise 3-5 times per week    Monitoring  test my blood glucose as discussed      Outcomes   Expected Outcomes Demonstrated interest in learning. Expect positive outcomes     Future DMSE 2 months    Program Status Not Completed             Individualized Plan for Diabetes Self-Management Training:   Learning Objective:  Patient will have a greater understanding of diabetes self-management. Patient education plan is to attend individual and/or group sessions per assessed needs and concerns.    Patient Instructions  Increase exercise: 3-5 times per week at 20 min Balance meals better: be mindful of bread intake, increase vegetables and protein based foods. Check blood sugar at least 1 time per day (Fasting 80-130 and 2 hours after meals less than 180)  Expected Outcomes:  Demonstrated interest in learning. Expect positive outcomes  Education material provided: ADA - How to Thrive: A Guide for Your Journey with Diabetes, Food label handouts, and Carbohydrate counting sheet  If problems or questions, patient to contact team via:  Phone and MyChart  Future DSME appointment: 2 months

## 2021-01-14 NOTE — Patient Instructions (Signed)
Increase exercise: 3-5 times per week at 20 min Balance meals better: be mindful of bread intake, increase vegetables and protein based foods. Check blood sugar at least 1 time per day (Fasting 80-130 and 2 hours after meals less than 180)

## 2021-01-26 ENCOUNTER — Ambulatory Visit (INDEPENDENT_AMBULATORY_CARE_PROVIDER_SITE_OTHER): Payer: No Typology Code available for payment source | Admitting: Obstetrics & Gynecology

## 2021-01-26 ENCOUNTER — Other Ambulatory Visit (HOSPITAL_COMMUNITY)
Admission: RE | Admit: 2021-01-26 | Discharge: 2021-01-26 | Disposition: A | Discharge status: Home / Self Care | Payer: No Typology Code available for payment source | Source: Ambulatory Visit | Attending: Obstetrics & Gynecology | Admitting: Obstetrics & Gynecology

## 2021-01-26 ENCOUNTER — Other Ambulatory Visit: Payer: Self-pay

## 2021-01-26 VITALS — BP 139/70 | HR 111 | Wt 220.0 lb

## 2021-01-26 DIAGNOSIS — Z789 Other specified health status: Secondary | ICD-10-CM

## 2021-01-26 DIAGNOSIS — Z3041 Encounter for surveillance of contraceptive pills: Secondary | ICD-10-CM | POA: Diagnosis not present

## 2021-01-26 DIAGNOSIS — Z3009 Encounter for other general counseling and advice on contraception: Secondary | ICD-10-CM | POA: Diagnosis not present

## 2021-01-26 DIAGNOSIS — Z01419 Encounter for gynecological examination (general) (routine) without abnormal findings: Secondary | ICD-10-CM | POA: Diagnosis present

## 2021-01-26 MED ORDER — NORGESTIMATE-ETH ESTRADIOL 0.25-35 MG-MCG PO TABS: 1.0000 | ORAL_TABLET | Freq: Every day | ORAL | 4 refills | Status: DC

## 2021-01-26 NOTE — Progress Notes (Signed)
Subjective:     Laura Miller is a 35 y.o. female here for a routine exam. On OCPs. No cycle issues. Current complaints: none. Is on 2 new meds for her diabetes. She recently met with a nutritionist.    Gynecologic History Patient's last menstrual period was 01/09/2021. Contraception: OCP (estrogen/progesterone) Last Pap: 12/09/2018. Results were: normal Last mammogram: n/a  Obstetric History OB History  Gravida Para Term Preterm AB Living  0 0 0 0 0 0  SAB IAB Ectopic Multiple Live Births  0 0 0 0 0     The following portions of the patient's history were reviewed and updated as appropriate: allergies, current medications, past family history, past medical history, past social history, past surgical history, and problem list.  Review of Systems Pertinent items are noted in HPI.    Objective:  BP 139/70   Pulse (!) 111   Wt 220 lb (99.8 kg)   LMP 01/09/2021   BMI 36.61 kg/m   General Appearance:    Alert, cooperative, no distress, appears stated age  Head:    Normocephalic, without obvious abnormality, atraumatic  Eyes:    conjunctiva/corneas clear, EOM's intact, both eyes  Ears:    Normal external ear canals, both ears  Nose:   Nares normal, septum midline, mucosa normal, no drainage    or sinus tenderness  Throat:   Lips, mucosa, and tongue normal; teeth and gums normal  Neck:   Supple, symmetrical, trachea midline, no adenopathy;    thyroid:  no enlargement/tenderness/nodules  Back:     Symmetric, no curvature, ROM normal, no CVA tenderness  Lungs:     respirations unlabored  Chest Wall:    No tenderness or deformity   Heart:    Regular rate and rhythm  Breast Exam:    No tenderness, masses, or nipple abnormality  Abdomen:     Soft, non-tender, bowel sounds active all four quadrants,    no masses, no organomegaly  Genitalia:    Normal female without lesion, discharge or tenderness     Extremities:   Extremities normal, atraumatic, no cyanosis or edema  Pulses:    2+ and symmetric all extremities  Skin:   Skin color, texture, turgor normal, no rashes or lesions     Assessment:    Healthy female exam.  Review exercise and strength training for weight loss/management.    Plan:   Laura Miller was seen today for gynecologic exam.  Diagnoses and all orders for this visit:  Well female exam with routine gynecological exam -     Cytology - PAP( [facility])  Educated about management of weight  Encounter for surveillance of contraceptive pills -     norgestimate-ethinyl estradiol (ORTHO-CYCLEN) 0.25-35 MG-MCG tablet; Take 1 tablet by mouth daily.  Encounter for counseling regarding contraception -     norgestimate-ethinyl estradiol (ORTHO-CYCLEN) 0.25-35 MG-MCG tablet; Take 1 tablet by mouth daily.   F/u in 1 year or sooner prn   Laura Miller, M.D., Laura Miller

## 2021-01-26 NOTE — Patient Instructions (Signed)
Exercising to Lose Weight Exercise is structured, repetitive physical activity to improve fitness and health. Getting regular exercise is important for everyone. It is especially important if you are overweight. Being overweight increases your risk of heart disease, stroke, diabetes, high blood pressure, and several types of cancer.Reducing your calorie intake and exercising can help you lose weight. Exercise is usually categorized as moderate or vigorous intensity. To lose weight, most people need to do a certain amount of moderate-intensity orvigorous-intensity exercise each week. Moderate-intensity exercise  Moderate-intensity exercise is any activity that gets you moving enough to burn at least three times more energy (calories) than if you were sitting. Examples of moderate exercise include: Walking a mile in 15 minutes. Doing light yard work. Biking at an easy pace. Most people should get at least 150 minutes (2 hours and 30 minutes) a week ofmoderate-intensity exercise to maintain their body weight. Vigorous-intensity exercise Vigorous-intensity exercise is any activity that gets you moving enough to burn at least six times more calories than if you were sitting. When you exercise at this intensity, you should be working hard enough that you are not able tocarry on a conversation. Examples of vigorous exercise include: Running. Playing a team sport, such as football, basketball, and soccer. Jumping rope. Most people should get at least 75 minutes (1 hour and 15 minutes) a week ofvigorous-intensity exercise to maintain their body weight. How can exercise affect me? When you exercise enough to burn more calories than you eat, you lose weight. Exercise also reduces body fat and builds muscle. The more muscle you have, the more calories you burn. Exercise also: Improves mood. Reduces stress and tension. Improves your overall fitness, flexibility, and endurance. Increases bone strength. The  amount of exercise you need to lose weight depends on: Your age. The type of exercise. Any health conditions you have. Your overall physical ability. Talk to your health care provider about how much exercise you need and whattypes of activities are safe for you. What actions can I take to lose weight? Nutrition  Make changes to your diet as told by your health care provider or diet and nutrition specialist (dietitian). This may include: Eating fewer calories. Eating more protein. Eating less unhealthy fats. Eating a diet that includes fresh fruits and vegetables, whole grains, low-fat dairy products, and lean protein. Avoiding foods with added fat, salt, and sugar. Drink plenty of water while you exercise to prevent dehydration or heat stroke.  Activity Choose an activity that you enjoy and set realistic goals. Your health care provider can help you make an exercise plan that works for you. Exercise at a moderate or vigorous intensity most days of the week. The intensity of exercise may vary from person to person. You can tell how intense a workout is for you by paying attention to your breathing and heartbeat. Most people will notice their breathing and heartbeat get faster with more intense exercise. Do resistance training twice each week, such as: Push-ups. Sit-ups. Lifting weights. Using resistance bands. Getting short amounts of exercise can be just as helpful as long structured periods of exercise. If you have trouble finding time to exercise, try to include exercise in your daily routine. Get up, stretch, and walk around every 30 minutes throughout the day. Go for a walk during your lunch break. Park your car farther away from your destination. If you take public transportation, get off one stop early and walk the rest of the way. Make phone calls while standing up and   walking around. Take the stairs instead of elevators or escalators. Wear comfortable clothes and shoes with  good support. Do not exercise so much that you hurt yourself, feel dizzy, or get very short of breath. Where to find more information [place] Department of Health and Human Services: www.hhs.gov Centers for Disease Control and Prevention (CDC): www.cdc.gov Contact a health care provider: Before starting a new exercise program. If you have questions or concerns about your weight. If you have a medical problem that keeps you from exercising. Get help right away if you have any of the following while exercising: Injury. Dizziness. Difficulty breathing or shortness of breath that does not go away when you stop exercising. Chest pain. Rapid heartbeat. Summary Being overweight increases your risk of heart disease, stroke, diabetes, high blood pressure, and several types of cancer. Losing weight happens when you burn more calories than you eat. Reducing the amount of calories you eat in addition to getting regular moderate or vigorous exercise each week helps you lose weight. This information is not intended to replace advice given to you by your health care provider. Make sure you discuss any questions you have with your healthcare provider. Document Revised: 09/29/2019 Document Reviewed: 10/16/2019 Elsevier Patient Education  2022 [employer] Inc[employer]  

## 2021-01-27 LAB — CYTOLOGY - PAP
Comment: NEGATIVE
Diagnosis: NEGATIVE
High risk HPV: NEGATIVE

## 2021-02-23 ENCOUNTER — Ambulatory Visit: Payer: PRIVATE HEALTH INSURANCE | Admitting: Nurse Practitioner

## 2021-02-24 LAB — HM DIABETES EYE EXAM

## 2021-03-11 ENCOUNTER — Encounter: Payer: No Typology Code available for payment source | Admitting: Registered"

## 2021-03-11 ENCOUNTER — Encounter: Payer: No Typology Code available for payment source | Attending: Nurse Practitioner | Admitting: Registered"

## 2021-03-11 ENCOUNTER — Encounter: Payer: Self-pay | Admitting: Registered"

## 2021-03-11 DIAGNOSIS — E1165 Type 2 diabetes mellitus with hyperglycemia: Secondary | ICD-10-CM | POA: Diagnosis not present

## 2021-03-11 NOTE — Patient Instructions (Addendum)
Consider contact your doctor's office about potential increase of metformin to help your fasting blood sugar Aim to continue creating boundaries around work and stop working earlier and get more sleep. Continue eat less bread, more vegetables & protein Exercise 3-5 times per week at 20 min - aim to do in the evening Check blood sugar at least 1 time per day (Fasting 80-130 and 2 hours after meals less than 180)

## 2021-03-11 NOTE — Progress Notes (Signed)
Virtual Visit via Video Note  I connected with Laura Miller on 03/11/21 at  8:00 AM EDT by a video enabled telemedicine application and verified that I am speaking with the correct person using two identifiers.  Location: Patient: work office Provider: [facility] Ginette Otto   I discussed the limitations of evaluation and management by telemedicine and the availability of in person appointments. The patient expressed understanding and agreed to proceed.   Diabetes Self-Management Education  Visit Type: Follow-up  Appt. Start Time: 0800  Appt. End Time: 0830  03/11/2021  [name], identified by name and date of birth, is a 35 y.o. female with a diagnosis of Diabetes: Type 2.   ASSESSMENT  There were no vitals taken for this visit. There is no height or weight on file to calculate BMI.  A1c 8.3% Metformin 750 mg; Rybelsus  Sleep: 6-8 hrs, goes to sleep earlier due to allergies making her tired Stress: has increased due to work changes and personal stuff mostly in August.  Pt reports she started changes first week after last visit including limiting bread intake and increasing fruit and vegetables, no icees, 3 days of walking, did a walk and kayaking in [place]. But then work and person stuff happened and not doing much since.  Self care: This weekend attending a candle making event.  SMBG: FBS: 188 this morning, during the day and 180s. Allergies may be increasing inflammation and blood sugar.  Patient states she is not sure how her blood pressure is doing. Pt states she has a cuff that she could check in her car. Just encouraged her to pay attention to what the readings are.  Patient states she would like to continue with same goals and also may consider asking MD about increasing metformin.   Diabetes Self-Management Education - 03/11/21 0817       Visit Information   Visit Type Follow-up      Initial Visit   Diabetes Type Type 2      Dietary Intake   Breakfast not  hungry with allergies, just hot black tea 1 pack of splenda    Lunch spaghetti, 5 meatballs, water    Snack (afternoon) green tea    Dinner brown rice, baked beans, smoked sausage, green peppers, unsweet tea    Snack (evening) a little bit of popcorn    Beverage(s) 3-4 bottles water, green tea, unsweet tea      Exercise   Exercise Type Light (walking / raking leaves)    How many days per week to you exercise? 2    How many minutes per day do you exercise? 30    Total minutes per week of exercise 60      Patient Education   Physical activity and exercise  Other (comment)   role of exercise in insulin resistance and FBS   Medications Reviewed patients medication for diabetes, action, purpose, timing of dose and side effects.    Monitoring Other (comment)   strategies to address fasting blood sugar     Outcomes   Expected Outcomes Demonstrated interest in learning. Expect positive outcomes    Future DMSE 2 months    Program Status Completed      Subsequent Visit   Since your last visit have you continued or begun to take your medications as prescribed? Yes    Since your last visit have you had your blood pressure checked? No    Since your last visit have you experienced any weight changes? No change  Since your last visit, are you checking your blood glucose at least once a day? Yes             Individualized Plan for Diabetes Self-Management Training:   Learning Objective:  Patient will have a greater understanding of diabetes self-management. Patient education plan is to attend individual and/or group sessions per assessed needs and concerns.   Patient Instructions  Consider contact your doctor's office about potential increase of metformin to help your fasting blood sugar Aim to continue creating boundaries around work and stop working earlier and get more sleep. Continue eat less bread, more vegetables & protein Exercise 3-5 times per week at 20 min - aim to do in the  evening Check blood sugar at least 1 time per day (Fasting 80-130 and 2 hours after meals less than 180)  Expected Outcomes:  Demonstrated interest in learning. Expect positive outcomes  Education material provided: none  If problems or questions, patient to contact team via:  Phone and MyChart  Future DSME appointment: 2 months  I discussed the assessment and treatment plan with the patient. The patient was provided an opportunity to ask questions and all were answered. The patient agreed with the plan and demonstrated an understanding of the instructions.   The patient was advised to call back or seek an in-person evaluation if the symptoms worsen or if the condition fails to improve as anticipated.  I provided 30 minutes of non-face-to-face time during this encounter.  Carolan Shiver, RD, CDCES

## 2021-05-16 ENCOUNTER — Telehealth: Payer: No Typology Code available for payment source | Admitting: Registered"

## 2021-06-20 ENCOUNTER — Encounter: Payer: No Typology Code available for payment source | Attending: Nurse Practitioner | Admitting: Registered"

## 2021-06-20 ENCOUNTER — Encounter: Payer: Self-pay | Admitting: Registered"

## 2021-06-20 DIAGNOSIS — E1165 Type 2 diabetes mellitus with hyperglycemia: Secondary | ICD-10-CM | POA: Insufficient documentation

## 2021-06-20 NOTE — Patient Instructions (Addendum)
Check your blood sugar more consistently: Fasting in the morning and 2 hours after dinner   Consider adding more protein to your breakfast. For dinner continue having protein and smaller amounts of carb and continue having vegetables often. Eat protein first to see if that helps.  Consider having herbal tea instead of black tea in the evening.  Make appointment to have your A1c rechecked.

## 2021-06-20 NOTE — Progress Notes (Signed)
Virtual Visit via Video Note  I connected with Laura Miller on 06/20/21 at  8:00 AM EST by a video enabled telemedicine application and verified that I am speaking with the correct person using two identifiers.  Location: Patient: work Provider: Romie Minus   I discussed the limitations of evaluation and management by telemedicine and the availability of in person appointments. The patient expressed understanding and agreed to proceed.  Diabetes Self-Management Education   Visit Type: Follow-up   Appt. Start Time: 0800            Appt. End Time: 0830  Assessment and Plan: A1c 8.3% Metformin 750 mg; Rybelsus (first thing am 30 min before food or drink)   Sleep: 6-8 hrs, still about the same, allergies are not bothering her now. Stress: Pt states she has moved and reduced stress.  SMBG: randomly checking. FBS has been up to 202 mg/dL, 30 min PPBG lunch 514 mg/dL, before dinner 604, 799 mg/dL Pt states she uses Verio One Touch. Pt states she doesn't check more consistently because she is always busy.  Pt states she continues to eat plenty of vegetables, but has been eating more carbs due to needing convenience, is on the road a lot.  Breakfast: banana or grapes and [place] mustard on thin bread OR granola Dinner: meatballs, french fries (a few) 7-7:30 dinner. No snacks after bed, has something to drink: zero sugar tea or lemonade or diet caffeine free soda. goes to bed by 11:30 pm  Pt states she has been going to [facility] with dad to help with grandmother who recently had a stroke.  Follow Up Instructions: Check your blood sugar more consistently: Fasting in the morning and 2 hours after dinner  Consider adding more protein to your breakfast. For dinner continue having protein and smaller amounts of carb and continue having vegetables often. Eat protein first to see if that helps. Consider having herbal tea instead of black tea in the evening. Make appointment to have  your A1c rechecked.   I discussed the assessment and treatment plan with the patient. The patient was provided an opportunity to ask questions and all were answered. The patient agreed with the plan and demonstrated an understanding of the instructions.   The patient was advised to call back or seek an in-person evaluation if the symptoms worsen or if the condition fails to improve as anticipated.  I provided 30 minutes of non-face-to-face time during this encounter.  Heywood Bene, RD, LDN, CDCES

## 2021-07-18 ENCOUNTER — Other Ambulatory Visit: Payer: Self-pay | Admitting: Nurse Practitioner

## 2021-07-18 DIAGNOSIS — E1165 Type 2 diabetes mellitus with hyperglycemia: Secondary | ICD-10-CM

## 2021-07-27 NOTE — Telephone Encounter (Signed)
Chart supports Rx Last seen 11/26/20 Next OV 08/01/21

## 2021-08-01 ENCOUNTER — Other Ambulatory Visit: Payer: Self-pay

## 2021-08-01 ENCOUNTER — Encounter: Payer: Self-pay | Admitting: Nurse Practitioner

## 2021-08-01 ENCOUNTER — Ambulatory Visit: Payer: No Typology Code available for payment source | Admitting: Nurse Practitioner

## 2021-08-01 VITALS — BP 140/86 | HR 116 | Temp 97.2°F | Ht 65.0 in | Wt 216.4 lb

## 2021-08-01 DIAGNOSIS — E7800 Pure hypercholesterolemia, unspecified: Secondary | ICD-10-CM

## 2021-08-01 DIAGNOSIS — E010 Iodine-deficiency related diffuse (endemic) goiter: Secondary | ICD-10-CM

## 2021-08-01 DIAGNOSIS — R809 Proteinuria, unspecified: Secondary | ICD-10-CM

## 2021-08-01 DIAGNOSIS — I10 Essential (primary) hypertension: Secondary | ICD-10-CM | POA: Diagnosis not present

## 2021-08-01 DIAGNOSIS — E66812 Obesity, class 2: Secondary | ICD-10-CM

## 2021-08-01 DIAGNOSIS — E1165 Type 2 diabetes mellitus with hyperglycemia: Secondary | ICD-10-CM

## 2021-08-01 DIAGNOSIS — Z6835 Body mass index (BMI) 35.0-35.9, adult: Secondary | ICD-10-CM

## 2021-08-01 LAB — LIPID PANEL
Cholesterol: 153 mg/dL (ref 0–200)
HDL: 54.4 mg/dL (ref >39.00)
LDL Cholesterol: 62 mg/dL (ref 0–99)
NonHDL: 98.67
Total CHOL/HDL Ratio: 3
Triglycerides: 181 mg/dL — ABNORMAL HIGH (ref 0.0–149.0)
VLDL: 36.2 mg/dL (ref 0.0–40.0)

## 2021-08-01 LAB — BASIC METABOLIC PANEL
BUN: 11 mg/dL (ref 6–23)
CO2: 26 mEq/L (ref 19–32)
Calcium: 9.9 mg/dL (ref 8.4–10.5)
Chloride: 99 mEq/L (ref 96–112)
Creatinine, Ser: 0.89 mg/dL (ref 0.40–1.20)
GFR: 84.19 mL/min (ref >60.00)
Glucose, Bld: 173 mg/dL — ABNORMAL HIGH (ref 70–99)
Potassium: 4.1 mEq/L (ref 3.5–5.1)
Sodium: 137 mEq/L (ref 135–145)

## 2021-08-01 LAB — TSH: TSH: 0.93 u[IU]/mL (ref 0.35–5.50)

## 2021-08-01 LAB — HEMOGLOBIN A1C: Hgb A1c MFr Bld: 8.6 % — ABNORMAL HIGH (ref 4.6–6.5)

## 2021-08-01 NOTE — Patient Instructions (Addendum)
Sign copy of eye exam report.  Go to lab for blood draw.  Start regular exercise: goal is 130mins per week Maintain low carb/low sugar diet. Maintain small meal portions  Diabetes Mellitus and Nutrition, Adult When you have diabetes, or diabetes mellitus, it is very important to have healthy eating habits because your blood sugar (glucose) levels are greatly affected by what you eat and drink. Eating healthy foods in the right amounts, at about the same times every day, can help you: Manage your blood glucose. Lower your risk of heart disease. Improve your blood pressure. Reach or maintain a healthy weight. What can affect my meal plan? Every person with diabetes is different, and each person has different needs for a meal plan. Your health care provider may recommend that you work with a dietitian to make a meal plan that is best for you. Your meal plan may vary depending on factors such as: The calories you need. The medicines you take. Your weight. Your blood glucose, blood pressure, and cholesterol levels. Your activity level. Other health conditions you have, such as heart or kidney disease. How do carbohydrates affect me? Carbohydrates, also called carbs, affect your blood glucose level more than any other type of food. Eating carbs raises the amount of glucose in your blood. It is important to know how many carbs you can safely have in each meal. This is different for every person. Your dietitian can help you calculate how many carbs you should have at each meal and for each snack. How does alcohol affect me? Alcohol can cause a decrease in blood glucose (hypoglycemia), especially if you use insulin or take certain diabetes medicines by mouth. Hypoglycemia can be a life-threatening condition. Symptoms of hypoglycemia, such as sleepiness, dizziness, and confusion, are similar to symptoms of having too much alcohol. Do not drink alcohol if: Your health care provider tells you not to  drink. You are pregnant, may be pregnant, or are planning to become pregnant. If you drink alcohol: Limit how much you have to: 0-1 drink a day for women. 0-2 drinks a day for men. Know how much alcohol is in your drink. In the [place] one drink equals one 12 oz bottle of beer (355 mL), one 5 oz glass of wine (148 mL), or one 1 oz glass of hard liquor (44 mL). Keep yourself hydrated with water, diet soda, or unsweetened iced tea. Keep in mind that regular soda, juice, and other mixers may contain a lot of sugar and must be counted as carbs. What are tips for following this plan? Reading food labels Start by checking the serving size on the Nutrition Facts label of packaged foods and drinks. The number of calories and the amount of carbs, fats, and other nutrients listed on the label are based on one serving of the item. Many items contain more than one serving per package. Check the total grams (g) of carbs in one serving. Check the number of grams of saturated fats and trans fats in one serving. Choose foods that have a low amount or none of these fats. Check the number of milligrams (mg) of salt (sodium) in one serving. Most people should limit total sodium intake to less than 2,300 mg per day. Always check the nutrition information of foods labeled as "low-fat" or "nonfat." These foods may be higher in added sugar or refined carbs and should be avoided. Talk to your dietitian to identify your daily goals for nutrients listed on the label. Shopping Avoid  buying canned, pre-made, or processed foods. These foods tend to be high in fat, sodium, and added sugar. Shop around the outside edge of the grocery store. This is where you will most often find fresh fruits and vegetables, bulk grains, fresh meats, and fresh dairy products. Cooking Use low-heat cooking methods, such as baking, instead of high-heat cooking methods, such as deep frying. Cook using healthy oils, such as olive, canola, or  sunflower oil. Avoid cooking with butter, cream, or high-fat meats. Meal planning Eat meals and snacks regularly, preferably at the same times every day. Avoid going long periods of time without eating. Eat foods that are high in fiber, such as fresh fruits, vegetables, beans, and whole grains. Eat 4-6 oz (112-168 g) of lean protein each day, such as lean meat, chicken, fish, eggs, or tofu. One ounce (oz) (28 g) of lean protein is equal to: 1 oz (28 g) of meat, chicken, or fish. 1 egg.  cup (62 g) of tofu. Eat some foods each day that contain healthy fats, such as avocado, nuts, seeds, and fish. What foods should I eat? Fruits Berries. Apples. Oranges. Peaches. Apricots. Plums. Grapes. Mangoes. Papayas. Pomegranates. Kiwi. Cherries. Vegetables Leafy greens, including lettuce, spinach, kale, chard, collard greens, mustard greens, and cabbage. Beets. Cauliflower. Broccoli. Carrots. Green beans. Tomatoes. Peppers. Onions. Cucumbers. Brussels sprouts. Grains Whole grains, such as whole-wheat or whole-grain bread, crackers, tortillas, cereal, and pasta. Unsweetened oatmeal. Quinoa. Brown or wild rice. Meats and other proteins Seafood. Poultry without skin. Lean cuts of poultry and beef. Tofu. Nuts. Seeds. Dairy Low-fat or fat-free dairy products such as milk, yogurt, and cheese. The items listed above may not be a complete list of foods and beverages you can eat and drink. Contact a dietitian for more information. What foods should I avoid? Fruits Fruits canned with syrup. Vegetables Canned vegetables. Frozen vegetables with butter or cream sauce. Grains Refined white flour and flour products such as bread, pasta, snack foods, and cereals. Avoid all processed foods. Meats and other proteins Fatty cuts of meat. Poultry with skin. Breaded or fried meats. Processed meat. Avoid saturated fats. Dairy Full-fat yogurt, cheese, or milk. Beverages Sweetened drinks, such as soda or iced tea. The  items listed above may not be a complete list of foods and beverages you should avoid. Contact a dietitian for more information. Questions to ask a health care provider Do I need to meet with a certified diabetes care and education specialist? Do I need to meet with a dietitian? What number can I call if I have questions? When are the best times to check my blood glucose? Where to find more information: American Diabetes Association: diabetes.org Academy of Nutrition and Dietetics: eatright.[employer] of Diabetes and Digestive and Kidney Diseases: Amen[employer].is Association of Diabetes Care & Education Specialists: diabeteseducator.org Summary It is important to have healthy eating habits because your blood sugar (glucose) levels are greatly affected by what you eat and drink. It is important to use alcohol carefully. A healthy meal plan will help you manage your blood glucose and lower your risk of heart disease. Your health care provider may recommend that you work with a dietitian to make a meal plan that is best for you. This information is not intended to replace advice given to you by your health care provider. Make sure you discuss any questions you have with your health care provider. Document Revised: 01/21/2020 Document Reviewed: 01/21/2020 Elsevier Patient Education  [place].

## 2021-08-01 NOTE — Assessment & Plan Note (Addendum)
Home glucose fasting: 155-250. 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 Maintain metformin dose Repeat hgbA1c, and BMP: Uncontrolled Dm with hgbA1c at 8.6%. stop rybelsus. Start ozempic injection. Maintain metformin dose. Switch rybelsus tabs to GLP-1 injection. We discussed possible side effects and mechanism of action. She verbalized understanding and agreed to switch.

## 2021-08-01 NOTE — Assessment & Plan Note (Signed)
Home BP readings: 131/89, 127/85, 119/83, 129/86. Current use of lisinopril and amlodipine BP at goal BP Readings from Last 3 Encounters:  08/01/21 140/86  01/26/21 139/70  11/26/20 132/88   Maintain med dose check BMP F/up in 36months

## 2021-08-01 NOTE — Progress Notes (Signed)
Subjective:  Patient ID: Laura Miller, female    DOB: 08/17/1985  Age: 36 y.o. MRN: [id number]  CC: Follow-up (6 month f/u on DM and HTN. Checks blood sugars at home and they range 155-231/Pt is fasting. )  HPI  Thyromegaly Repeat TSH  HTN (hypertension), benign Home BP readings: 131/89, 127/85, 119/83, 129/86. Current use of lisinopril and amlodipine BP at goal BP Readings from Last 3 Encounters:  08/01/21 140/86  01/26/21 139/70  11/26/20 132/88   Maintain med dose check BMP F/up in 64months  DM (diabetes mellitus) ([place]) Home glucose fasting: 155-250. 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 Maintain metformin dose Repeat hgbA1c, and BMP: Uncontrolled Dm with hgbA1c at 8.6%. stop rybelsus. Start ozempic injection. Maintain metformin dose. Switch rybelsus tabs to GLP-1 injection. We discussed possible side effects and mechanism of action. She verbalized understanding and agreed to switch.  Morbid obesity ([place]) Discussed ways to incorporate exercise and maintain small meal potions  Wt Readings from Last 3 Encounters:  08/01/21 216 lb 6.4 oz (98.2 kg)  01/26/21 220 lb (99.8 kg)  01/14/21 218 lb 14.4 oz (99.3 kg)    BP Readings from Last 3 Encounters:  08/01/21 140/86  01/26/21 139/70  11/26/20 132/88    Reviewed past Medical, Social and Family history today.  Outpatient Medications Prior to Visit  Medication Sig Dispense Refill   fluticasone (FLONASE) 50 MCG/ACT nasal spray Place 1 spray into both nostrils daily.     glucose blood (ONETOUCH VERIO) test strip USE TO CHECK BLOOD SUGAR ONCE A DAY 90 each 3   norgestimate-ethinyl estradiol (ORTHO-CYCLEN) 0.25-35 MG-MCG tablet Take 1 tablet by mouth daily. 3 tablet 4   OneTouch Delica Lancets 99991111 MISC  USE TO CHECK BLOOD SUGAR DAILY 100 each 3   Probiotic Product (PROBIOTIC DAILY) CAPS Take 1 capsule by mouth daily.     amLODipine (NORVASC) 5 MG tablet TAKE 1 TABLET(5 MG) BY MOUTH DAILY 90 tablet 3   ezetimibe (ZETIA) 10 MG tablet Take 1 tablet (10 mg total) by mouth daily. 90 tablet 3   lisinopril (ZESTRIL) 2.5 MG tablet TAKE 1 TABLET(2.5 MG) BY MOUTH DAILY 90 tablet 3   metFORMIN (GLUCOPHAGE-XR) 750 MG 24 hr tablet Take 1 tablet (750 mg total) by mouth daily with breakfast. 90 tablet 3   RYBELSUS 3 MG TABS TAKE 1 TABLET BY MOUTH DAILY 30 tablet 5   No facility-administered medications prior to visit.    ROS See HPI  Objective:  BP 140/86 (BP Location: Left Arm, Patient Position: Sitting, Cuff Size: Large)    Pulse (!) 116    Temp (!) 97.2 F (36.2 C) (Temporal)    Ht 5\' 5"  (1.651 m)    Wt 216 lb 6.4 oz (98.2 kg)    SpO2 98%    BMI 36.01 kg/m   Physical Exam Vitals reviewed.  Cardiovascular:     Rate and Rhythm: Normal rate.     Pulses: Normal pulses.  Pulmonary:     Effort: Pulmonary effort is normal.  Neurological:     Mental Status: She is alert and oriented to person, place, and time.  Psychiatric:        Mood and Affect: Mood normal.        Behavior: Behavior normal.  Thought Content: Thought content normal.   Assessment & Plan:  This visit occurred during the SARS-CoV-2 public health emergency.  Safety protocols were in place, including screening questions prior to the visit, additional usage of staff PPE, and extensive cleaning of exam room while observing appropriate contact time as indicated for disinfecting solutions.   Laura Miller was seen today for follow-up.  Diagnoses and all orders for this visit:  Type 2 diabetes mellitus with hyperglycemia, without long-term current use of insulin (HCC) -     Hemoglobin A1c -     Cancel: Comprehensive metabolic panel -     Basic metabolic panel -     0000000 or 0.5MG /DOS, (OZEMPIC, 0.25 OR 0.5 MG/DOSE,) 2  MG/1.5ML SOPN; 0.25mg  weekly x 1week, then 0.5mg  weekly till next office visit -     ezetimibe (ZETIA) 10 MG tablet; Take 1 tablet (10 mg total) by mouth daily. -     metFORMIN (GLUCOPHAGE-XR) 750 MG 24 hr tablet; Take 1 tablet (750 mg total) by mouth daily with breakfast.  HTN (hypertension), benign -     Cancel: Comprehensive metabolic panel -     Basic metabolic panel  Thyromegaly -     TSH  Pure hypercholesterolemia -     Lipid panel -     Cancel: Comprehensive metabolic panel -     ezetimibe (ZETIA) 10 MG tablet; Take 1 tablet (10 mg total) by mouth daily.  Class 2 severe obesity due to excess calories with serious comorbidity and body mass index (BMI) of 35.0 to 35.9 in adult [facility])  Essential hypertension -     amLODipine (NORVASC) 5 MG tablet; Take 1 tablet (5 mg total) by mouth daily. -     lisinopril (ZESTRIL) 2.5 MG tablet; Take 1 tablet (2.5 mg total) by mouth daily.  Microalbuminuria -     metFORMIN (GLUCOPHAGE-XR) 750 MG 24 hr tablet; Take 1 tablet (750 mg total) by mouth daily with breakfast.   Problem List Items Addressed This Visit       Cardiovascular and Mediastinum   HTN (hypertension), benign    Home BP readings: 131/89, 127/85, 119/83, 129/86. Current use of lisinopril and amlodipine BP at goal BP Readings from Last 3 Encounters:  08/01/21 140/86  01/26/21 139/70  11/26/20 132/88   Maintain med dose check BMP F/up in 69months      Relevant Medications   ezetimibe (ZETIA) 10 MG tablet   amLODipine (NORVASC) 5 MG tablet   lisinopril (ZESTRIL) 2.5 MG tablet   Other Relevant Orders   Basic metabolic panel (Completed)     Endocrine   DM (diabetes mellitus) ([place]) - Primary    Home glucose fasting: 155-250. 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 Maintain metformin dose Repeat hgbA1c, and BMP: Uncontrolled Dm with hgbA1c at 8.6%. stop rybelsus. Start ozempic injection. Maintain metformin dose. Switch rybelsus tabs to GLP-1 injection. We discussed possible side effects and mechanism of action. She verbalized understanding and agreed to switch.      Relevant Medications   Semaglutide,0.25 or 0.5MG /DOS, (OZEMPIC, 0.25 OR 0.5 MG/DOSE,) 2 MG/1.5ML SOPN   ezetimibe (ZETIA) 10 MG tablet   lisinopril (ZESTRIL) 2.5 MG tablet   metFORMIN (GLUCOPHAGE-XR) 750 MG 24 hr tablet   Other Relevant Orders   Hemoglobin A1c (Completed)  Basic metabolic panel (Completed)   Thyromegaly    Repeat TSH      Relevant Orders   TSH (Completed)     Other   Microalbuminuria   Relevant Medications   metFORMIN (GLUCOPHAGE-XR) 750 MG 24 hr tablet   Pure hypercholesterolemia   Relevant Medications   ezetimibe (ZETIA) 10 MG tablet   amLODipine (NORVASC) 5 MG tablet   lisinopril (ZESTRIL) 2.5 MG tablet   Other Relevant Orders   Lipid panel (Completed)   Other Visit Diagnoses     Class 2 severe obesity due to excess calories with serious comorbidity and body mass index (BMI) of 35.0 to 35.9 in adult [facility])   (Chronic)     Relevant Medications   Semaglutide,0.25 or 0.5MG /DOS, (OZEMPIC, 0.25 OR 0.5 MG/DOSE,) 2 MG/1.5ML SOPN   metFORMIN (GLUCOPHAGE-XR) 750 MG 24 hr tablet   Essential hypertension       Relevant Medications   ezetimibe (ZETIA) 10 MG tablet   amLODipine (NORVASC) 5 MG tablet   lisinopril (ZESTRIL) 2.5 MG tablet       Follow-up: Return in about 3 months (around 10/30/2021) for DM and HTN, hyperlipidemia (fasting).  Wilfred Lacy, NP

## 2021-08-01 NOTE — Assessment & Plan Note (Signed)
>>  ASSESSMENT AND PLAN FOR TYPE 2 DIABETES MELLITUS WITH HYPERGLYCEMIA, WITHOUT LONG-TERM CURRENT USE OF INSULIN (HCC) WRITTEN ON 08/05/2021  2:47 PM BY Dewan Emond LUM, NP  Home glucose fasting: 155-250. 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 Maintain metformin  dose Repeat hgbA1c, and BMP: Uncontrolled Dm with hgbA1c at 8.6%. stop rybelsus . Start ozempic  injection. Maintain metformin  dose. Switch rybelsus  tabs to GLP-1 injection. We discussed possible side effects and mechanism of action. She verbalized understanding and agreed to switch.

## 2021-08-01 NOTE — Assessment & Plan Note (Signed)
Discussed ways to incorporate exercise and maintain small meal potions

## 2021-08-01 NOTE — Assessment & Plan Note (Addendum)
Repeat TSH normal

## 2021-08-05 ENCOUNTER — Encounter: Payer: Self-pay | Admitting: Nurse Practitioner

## 2021-08-05 MED ORDER — METFORMIN HCL ER 750 MG PO TB24: 750.0000 mg | ORAL_TABLET | Freq: Every day | ORAL | 3 refills | Status: DC

## 2021-08-05 MED ORDER — EZETIMIBE 10 MG PO TABS: 10.0000 mg | ORAL_TABLET | Freq: Every day | ORAL | 3 refills | Status: DC

## 2021-08-05 MED ORDER — LISINOPRIL 2.5 MG PO TABS: 2.5000 mg | ORAL_TABLET | Freq: Every day | ORAL | 3 refills | Status: DC

## 2021-08-05 MED ORDER — OZEMPIC (0.25 OR 0.5 MG/DOSE) 2 MG/1.5ML ~~LOC~~ SOPN: PEN_INJECTOR | SUBCUTANEOUS | 1 refills | Status: DC

## 2021-08-05 MED ORDER — AMLODIPINE BESYLATE 5 MG PO TABS: 5.0000 mg | ORAL_TABLET | Freq: Every day | ORAL | 3 refills | Status: DC

## 2021-08-15 ENCOUNTER — Other Ambulatory Visit: Payer: Self-pay

## 2021-08-15 ENCOUNTER — Encounter: Payer: No Typology Code available for payment source | Attending: Nurse Practitioner | Admitting: Registered"

## 2021-08-15 ENCOUNTER — Encounter: Payer: Self-pay | Admitting: Registered"

## 2021-08-15 VITALS — Wt 216.9 lb

## 2021-08-15 DIAGNOSIS — E1165 Type 2 diabetes mellitus with hyperglycemia: Secondary | ICD-10-CM | POA: Insufficient documentation

## 2021-08-15 NOTE — Patient Instructions (Signed)
Check blood sugar fasting in the morning and 2 hours after dinner Smoothie: use plain [place] yogurt and instead juice either unsweetened almond milk or water for some liquid. Consider measuring out your pasta and rice dishes and keeping them to 1 c or less. Look into the ways your fitness tracker can help motivate you to get more exercise with 150 min per week goal Ways to break it up: Daily for 20 zone min 5 x/ week for 30 min 3x/ week for 45 min

## 2021-08-15 NOTE — Progress Notes (Signed)
Diabetes Self-Management Education  Visit Type: Follow-up  Appt. Start Time: 0810 Appt. End Time: 0845  08/15/2021  Laura Miller, identified by name and date of birth, is a 36 y.o. female with a diagnosis of Diabetes: Type 2.   ASSESSMENT  Weight 216 lb 14.4 oz (98.4 kg). Body mass index is 36.09 kg/m. Marland Kitchenlast  A1c 8.6% Metformin 750 mg; Ozempic started yesterday  Pt states her PCP stopped Rybelsus because FBS is too high. FBS 174-238, not checking 2 hrs PPBG.  Pt states she has not had a slushi in a long time. Pt states she is eating more nuts and cheese for snacks and choosing apples more often when having fruit. Although per 24 hr recall pt reports having mac and cheese, pt states she does not make it that often. Pt reports she uses whole grain spaghetti for her baked spaghetti meal.  Pt states it is hard to fit exercise into her day. Pt states she has a fitness tracker but hasn't been tracking any specific goals.   Diabetes Self-Management Education - 08/15/21 0800       Visit Information   Visit Type Follow-up      Initial Visit   Diabetes Type Type 2      Complications   Last HgB A1C per patient/outside source 8.6 %    How often do you check your blood sugar? 1-2 times/day    Fasting Blood glucose range (mg/dL) 180-200;130-179   174-238     Dietary Intake   Breakfast fruit (more apples)    Snack (morning) walnuts    Lunch chicken patti, mac & cheese    Snack (afternoon) smoothie; frozen fruit, granola, Strawb [place] yogurt, a little orange juice    Dinner chicken wings, mac & cheese, mixed veggies, 3 meatballs    Snack (evening) nuts & cheese    Beverage(s) water      Patient Education   Physical activity and exercise  Role of exercise on diabetes management, blood pressure control and cardiac health.    Medications Reviewed patients medication for diabetes, action, purpose, timing of dose and side effects.    Monitoring Purpose and frequency of SMBG.       Individualized Goals (developed by patient)   Nutrition Follow meal plan discussed    Physical Activity --   150 min per week   Monitoring  test my blood glucose as discussed      Patient Self-Evaluation of Goals - Patient rates self as meeting previously set goals (% of time)   Nutrition >75%      Outcomes   Expected Outcomes Demonstrated interest in learning. Expect positive outcomes    Future DMSE 2 months    Program Status Completed      Subsequent Visit   Since your last visit have you continued or begun to take your medications as prescribed? Yes    Since your last visit have you had your blood pressure checked? Yes             Individualized Plan for Diabetes Self-Management Training:   Learning Objective:  Patient will have a greater understanding of diabetes self-management. Patient education plan is to attend individual and/or group sessions per assessed needs and concerns.    Patient Instructions  Check blood sugar fasting in the morning and 2 hours after dinner Smoothie: use plain [place] yogurt and instead juice either unsweetened almond milk or water for some liquid. Consider measuring out your pasta and rice dishes and keeping  them to 1 c or less. Look into the ways your fitness tracker can help motivate you to get more exercise with 150 min per week goal Ways to break it up: Daily for 20 zone min 5 x/ week for 30 min 3x/ week for 45 min   Expected Outcomes:  Demonstrated interest in learning. Expect positive outcomes  Education material provided: Using Plate Method ([employer])  If problems or questions, patient to contact team via:  Phone and MyPlate  Future DSME appointment: 2 months

## 2021-10-07 ENCOUNTER — Other Ambulatory Visit: Payer: Self-pay | Admitting: Nurse Practitioner

## 2021-10-07 ENCOUNTER — Encounter: Payer: No Typology Code available for payment source | Attending: Nurse Practitioner | Admitting: Registered"

## 2021-10-07 DIAGNOSIS — Z7984 Long term (current) use of oral hypoglycemic drugs: Secondary | ICD-10-CM | POA: Diagnosis not present

## 2021-10-07 DIAGNOSIS — Z713 Dietary counseling and surveillance: Secondary | ICD-10-CM | POA: Diagnosis not present

## 2021-10-07 DIAGNOSIS — E1165 Type 2 diabetes mellitus with hyperglycemia: Secondary | ICD-10-CM | POA: Insufficient documentation

## 2021-10-07 NOTE — Progress Notes (Signed)
Diabetes Self-Management Education ? ?Visit Type: Follow-up ? ?Appt. Start Time: 0800 Appt. End Time: 0830 ? ?10/07/2021 ? ?Laura Miller, identified by name and date of birth, is a 36 y.o. female with a diagnosis of Diabetes: Type 2.  ? ?ASSESSMENT ? ?There were no vitals taken for this visit. ?There is no height or weight on file to calculate BMI. ? ?A1c 8.6% ?Metformin 750 mg; Ozempic started Feb 12 ?  ?SMBG: FBS 112- 130 mg/dL once was 180 mg/dL ? ?Sleep: ~7 hrs, is improving, winding down at night before bed. ?Stress: not assessed this visit ? ?Pt reports appetite changes and reduced intake, stopped craving pasta and fries, hasn't had in several weeks. Pt states she usually has several servings of vegetables daily, doesn't really want to eat fruit.  ? ?Physical activity: has a friend that is a Technical sales engineer and will provide accountability and helping to come up with a specific plan 4x/week M/W/F/Su. ? Diabetes Self-Management Education - 10/07/21 0800   ? ?  ? Visit Information  ? Visit Type Follow-up   ?  ? Initial Visit  ? Diabetes Type Type 2   ?  ? Complications  ? How often do you check your blood sugar? 3-4 times / week   ? Fasting Blood glucose range (mg/dL) 70-129   ?  ? Dietary Intake  ? Breakfast 1 egg omlette   ? Lunch amburger patty, lettuce, tomato   ? Dinner pot roast a little mac & cheese   ? Beverage(s) water, occassional diet green tea   ?  ? Exercise  ? Exercise Type Moderate (swimming / aerobic walking)   walking and eliptical  ? How many days per week to you exercise? 4   counting steps  ? How many minutes per day do you exercise? --   counting steps 4K  ?  ? Patient Education  ? Medications Reviewed patients medication for diabetes, action, purpose, timing of dose and side effects.   ? Monitoring Purpose and frequency of SMBG.   ?  ? Individualized Goals (developed by patient)  ? Nutrition General guidelines for healthy choices and portions discussed   ? Physical Activity Exercise 3-5  times per week   ? Monitoring  test my blood glucose as discussed   ?  ? Patient Self-Evaluation of Goals - Patient rates self as meeting previously set goals (% of time)  ? Nutrition >75%   ? Reducing Risk >75%   ?  ? Outcomes  ? Expected Outcomes Demonstrated interest in learning. Expect positive outcomes   ? Future DMSE 3-4 months   ? Program Status Completed   ?  ? Subsequent Visit  ? Since your last visit have you continued or begun to take your medications as prescribed? Yes   ? ?  ?  ? ?  ? ? ?Individualized Plan for Diabetes Self-Management Training:  ? ?Learning Objective:  Patient will have a greater understanding of diabetes self-management. ?Patient education plan is to attend individual and/or group sessions per assessed needs and concerns. ?  ? ?Patient Instructions  ?Goals: ?Exercise: continue working with personal trainer ?Diet:  Whole grains, 5 servings of fruits and vegetables, lean protein, including heart healthy fats ?Sleep: Continue with winding down before bed to get good quality sleep ? ?Expected Outcomes:  Demonstrated interest in learning. Expect positive outcomes ? ?Education material provided: Using Plate Method [employer]) ? ?If problems or questions, patient to contact team via:  Phone and MyPlate ? ?Future  DSME appointment: 3-4 months ?

## 2021-10-07 NOTE — Patient Instructions (Signed)
Goals: ?Exercise: continue working with personal trainer ?Diet:  Whole grains, 5 servings of fruits and vegetables, lean protein, including heart healthy fats ?Sleep: Continue with winding down before bed to get good quality sleep ?

## 2021-10-31 ENCOUNTER — Encounter: Payer: Self-pay | Admitting: Nurse Practitioner

## 2021-10-31 ENCOUNTER — Ambulatory Visit: Payer: No Typology Code available for payment source | Admitting: Nurse Practitioner

## 2021-10-31 VITALS — BP 136/86 | HR 106 | Temp 97.0°F | Ht 65.0 in | Wt 213.2 lb

## 2021-10-31 DIAGNOSIS — E1165 Type 2 diabetes mellitus with hyperglycemia: Secondary | ICD-10-CM | POA: Diagnosis not present

## 2021-10-31 DIAGNOSIS — I10 Essential (primary) hypertension: Secondary | ICD-10-CM | POA: Diagnosis not present

## 2021-10-31 LAB — HEMOGLOBIN A1C: Hgb A1c MFr Bld: 7.3 % — ABNORMAL HIGH (ref 4.6–6.5)

## 2021-10-31 MED ORDER — SEMAGLUTIDE (1 MG/DOSE) 4 MG/3ML ~~LOC~~ SOPN: 1.0000 mg | PEN_INJECTOR | SUBCUTANEOUS | 2 refills | Status: DC

## 2021-10-31 NOTE — Patient Instructions (Addendum)
Go to lab ?Increase ozempic to 1mg  weekly ?Have eye exam report sent to me once completed. ? ?

## 2021-10-31 NOTE — Progress Notes (Signed)
? ?             Established Patient Visit ? ?Patient: Laura Miller   DOB: [birth year]   35 y.o. Female  MRN: [id number] ?Visit Date: 10/31/2021 ? ?Subjective:  ?  ?Chief Complaint  ?Patient presents with  ? Follow-up  ?  3 month f/u on DM, HTN, and cholesterol.  ?Pt checks blood sugars at home and they range 116-184 ?Pt is fasting today.  ?No other questions or concerns.   ? ?HPI ?Type 2 diabetes mellitus with hyperglycemia, without long-term current use of insulin (HCC) ?Denies any adverse effects with ozempic 0.5mg  weekly ?Fasting glucose 80s ?Postprandial glucose: 180s ?Lost 3lbs in last 33months ?Exercise: strength training and cardio 4x/week ? ?Check hgbA1c today ?Increase ozempic dose to 1mg  weekly. ?F/up in 88months ? ? ?HTN (hypertension), benign ?BP remains at goal ?BP Readings from Last 3 Encounters:  ?10/31/21 136/86  ?08/01/21 140/86  ?01/26/21 139/70  ? ?Maintain med doses ? ?Wt Readings from Last 3 Encounters:  ?10/31/21 213 lb 3.2 oz (96.7 kg)  ?08/15/21 216 lb 14.4 oz (98.4 kg)  ?08/01/21 216 lb 6.4 oz (98.2 kg)  ?  ?Reviewed medical, surgical, and social history today ? ?Medications: ?Outpatient Medications Prior to Visit  ?Medication Sig  ? amLODipine (NORVASC) 5 MG tablet Take 1 tablet (5 mg total) by mouth daily.  ? ezetimibe (ZETIA) 10 MG tablet Take 1 tablet (10 mg total) by mouth daily.  ? fluticasone (FLONASE) 50 MCG/ACT nasal spray Place 1 spray into both nostrils daily.  ? glucose blood (ONETOUCH VERIO) test strip USE TO CHECK BLOOD SUGAR ONCE A DAY  ? lisinopril (ZESTRIL) 2.5 MG tablet Take 1 tablet (2.5 mg total) by mouth daily.  ? metFORMIN (GLUCOPHAGE-XR) 750 MG 24 hr tablet Take 1 tablet (750 mg total) by mouth daily with breakfast.  ? norgestimate-ethinyl estradiol (ORTHO-CYCLEN) 0.25-35 MG-MCG tablet Take 1 tablet by mouth daily.  ? OneTouch Delica Lancets 33G MISC USE TO CHECK BLOOD SUGAR DAILY  ? Probiotic Product (PROBIOTIC DAILY) CAPS Take 1 capsule by mouth daily.  ?  [DISCONTINUED] Semaglutide,0.25 or 0.5MG /DOS, (OZEMPIC, 0.25 OR 0.5 MG/DOSE,) 2 MG/1.5ML SOPN 0.25mg  weekly x 1week, then 0.5mg  weekly till next office visit  ? ?No facility-administered medications prior to visit.  ? ?Reviewed past medical and social history.  ? ?ROS per HPI above ? ? ?   ?Objective:  ?BP 136/86 (BP Location: Left Arm, Patient Position: Sitting, Cuff Size: Large)   Pulse (!) 106   Temp (!) 97 ?F (36.1 ?C) (Temporal)   Ht 5\' 5"  (1.651 m)   Wt 213 lb 3.2 oz (96.7 kg)   SpO2 100%   BMI 35.48 kg/m?  ? ?  ? ?Physical Exam ?Constitutional:   ?   Appearance: She is obese.  ?Cardiovascular:  ?   Rate and Rhythm: Normal rate.  ?   Pulses: Normal pulses.     ?     Dorsalis pedis pulses are 2+ on the right side and 2+ on the left side.  ?     Posterior tibial pulses are 2+ on the right side and 2+ on the left side.  ?Pulmonary:  ?   Effort: Pulmonary effort is normal.  ?Musculoskeletal:  ?   Right lower leg: No edema.  ?   Left lower leg: No edema.  ?   Right foot: Normal range of motion. No deformity, bunion, Charcot foot, foot drop or prominent metatarsal heads.  ?   Left foot:  Normal range of motion. No deformity, bunion, Charcot foot, foot drop or prominent metatarsal heads.  ?Feet:  ?   Right foot:  ?   Protective Sensation: 7 sites tested.  7 sites sensed.  ?   Skin integrity: Skin integrity normal. No skin breakdown or erythema.  ?   Toenail Condition: Right toenails are normal.  ?   Left foot:  ?   Protective Sensation: 7 sites tested.  7 sites sensed.  ?   Skin integrity: Skin integrity normal. No skin breakdown or erythema.  ?   Toenail Condition: Left toenails are normal.  ?Neurological:  ?   Mental Status: She is alert and oriented to person, place, and time.  ?  ?No results found for any visits on 10/31/21. ?   ?Assessment & Plan:  ?  ?Problem List Items Addressed This Visit   ? ?  ? Cardiovascular and Mediastinum  ? HTN (hypertension), benign - Primary  ?  BP remains at goal ?BP Readings  from Last 3 Encounters:  ?10/31/21 136/86  ?08/01/21 140/86  ?01/26/21 139/70  ? ?Maintain med doses ?  ?  ?  ? Endocrine  ? Type 2 diabetes mellitus with hyperglycemia, without long-term current use of insulin (HCC)  ?  Denies any adverse effects with ozempic 0.5mg  weekly ?Fasting glucose 80s ?Postprandial glucose: 180s ?Lost 3lbs in last 69months ?Exercise: strength training and cardio 4x/week ? ?Check hgbA1c today ?Increase ozempic dose to 1mg  weekly. ?F/up in 53months ? ? ?  ?  ? Relevant Medications  ? Semaglutide, 1 MG/DOSE, 4 MG/3ML SOPN  ? Other Relevant Orders  ? Hemoglobin A1c  ? ?Return in about 3 months (around 01/31/2022) for CPE (fasting). ? ?  ? ?04/02/2022, NP ? ? ?

## 2021-10-31 NOTE — Assessment & Plan Note (Signed)
>>  ASSESSMENT AND PLAN FOR TYPE 2 DIABETES MELLITUS WITH HYPERGLYCEMIA, WITHOUT LONG-TERM CURRENT USE OF INSULIN (HCC) WRITTEN ON 10/31/2021  8:28 AM BY Raford Brissett LUM, NP  Denies any adverse effects with ozempic  0.5mg  weekly Fasting glucose 80s Postprandial glucose: 180s Lost 3lbs in last 2months Exercise: strength training and cardio 4x/week  Check hgbA1c today Increase ozempic  dose to 1mg  weekly. F/up in 3months

## 2021-10-31 NOTE — Assessment & Plan Note (Signed)
BP remains at goal ?BP Readings from Last 3 Encounters:  ?10/31/21 136/86  ?08/01/21 140/86  ?01/26/21 139/70  ? ?Maintain med doses ?

## 2021-10-31 NOTE — Assessment & Plan Note (Addendum)
Denies any adverse effects with ozempic 0.5mg  weekly ?Fasting glucose 80s ?Postprandial glucose: 180s ?Lost 3lbs in last 63months ?Exercise: strength training and cardio 4x/week ? ?Check hgbA1c today ?Increase ozempic dose to 1mg  weekly. ?F/up in 17months ? ?

## 2021-11-23 ENCOUNTER — Encounter: Payer: Self-pay | Admitting: General Practice

## 2022-01-06 ENCOUNTER — Encounter: Payer: No Typology Code available for payment source | Attending: Nurse Practitioner | Admitting: Registered"

## 2022-01-06 VITALS — Ht 65.0 in | Wt 206.0 lb

## 2022-01-06 DIAGNOSIS — E1165 Type 2 diabetes mellitus with hyperglycemia: Secondary | ICD-10-CM | POA: Insufficient documentation

## 2022-01-06 DIAGNOSIS — Z713 Dietary counseling and surveillance: Secondary | ICD-10-CM | POA: Insufficient documentation

## 2022-01-06 NOTE — Progress Notes (Signed)
Diabetes Self-Management Education  Visit Type: Follow-up  Appt. Start Time: 0805 Appt. End Time: 0840  01/06/2022  [name], identified by name and date of birth, is a 36 y.o. female with a diagnosis of Diabetes: Type 2.   ASSESSMENT  Body Composition Scale Date 01/06/22  Current Body Weight 206  Total Body Fat % 38.4  Visceral Fat 1-9 = healthy 10-4 = high 15+ = very high 10  Fat-Free Mass % 61.5   Total Body Water %   Muscle-Mass lbs 45.2  BMI 33.9  Body Fat Displacement          Torso  lbs 48.9         Left Leg  lbs 9.7         Right Leg  lbs 9.7         Left Arm  lbs 4.8         Right Arm   lbs 4.8    A1c 7.3% Metformin 750 mg; Ozempic started Feb 12, current dose 1 mg   SMBG: FBS 127-144 mg/dL PPBG: 623-762 mg/dL  Sleep: 7-8 hrs, still improving, sets timer and doesn't stay up watching TV and relax and falling asleep. Stress: 4/10, has come down. Getting ready to start a new job in [employer].  Diet: watching what she eats, just went to beach and was making healthy choices. Include foods such as whole grain pasta and vegetables most days. Pt reports she doesn't buy cookies or snack stuff. Occasionally eats, but not craving and does not have in the house. Eating vegetables daily, but not likely 3-5 servings every day. Reduced appetite, not hungry for breakfast.   Physical activity: 3-4x/week trainer help set up a plan 60 min dumbell press, bike, eliptical, pushups, squats, steps 3-4K steps and trying to increase.   Diabetes Self-Management Education - 01/06/22 0819       Visit Information   Visit Type Follow-up      Dietary Intake   Breakfast peanut butter crackers    Lunch spaghetti, wheat noodles, ground [place]    Dinner curry goat, carrots, brown rice   usually around 7:30     Activity / Exercise   Activity / Exercise Type Moderate (swimming / aerobic walking)    How many days per week do you exercise? 4    How many minutes per day do you  exercise? 60    Total minutes per week of exercise 240      Patient Education   Being Active Role of exercise on diabetes management, blood pressure control and cardiac health.;Other (comment)   visceral fat and liver health     Individualized Goals (developed by patient)   Nutrition General guidelines for healthy choices and portions discussed    Physical Activity Exercise 3-5 times per week   have form evaluated     Patient Self-Evaluation of Goals - Patient rates self as meeting previously set goals (% of time)   Nutrition >75% (most of the time)    Physical Activity >75% (most of the time)      Outcomes   Expected Outcomes Demonstrated interest in learning. Expect positive outcomes    Future DMSE 2 months    Program Status Completed      Subsequent Visit   Since your last visit have you experienced any weight changes? Loss             Individualized Plan for Diabetes Self-Management Training:   Learning Objective:  Patient  will have a greater understanding of diabetes self-management. Patient education plan is to attend individual and/or group sessions per assessed needs and concerns.   Patient Instructions  Call insurance to see if they cover the continuous glucose monitor.  Protein for breakfast: [place] Yogurt OR peanut butter and whole grain bread OR hard boiled egg Lunch & Dinner: include veggies and aim for 3-5 servings per day. Serving = 1 cup raw or 1/2 c cooked  Continue exercise plan, look for someone who can help you make sure you are using good form with your weight lifting.   Expected Outcomes:  Demonstrated interest in learning. Expect positive outcomes  Education material provided: My Plate  If problems or questions, patient to contact team via:  Phone and MyChart  Future DSME appointment: 2 months

## 2022-01-06 NOTE — Patient Instructions (Addendum)
Call insurance to see if they cover the continuous glucose monitor.  Protein for breakfast: [place] Yogurt OR peanut butter and whole grain bread OR hard boiled egg Lunch & Dinner: include veggies and aim for 3-5 servings per day. Serving = 1 cup raw or 1/2 c cooked  Continue exercise plan, look for someone who can help you make sure you are using good form with your weight lifting.

## 2022-01-15 ENCOUNTER — Other Ambulatory Visit: Payer: Self-pay | Admitting: Nurse Practitioner

## 2022-01-15 DIAGNOSIS — E1165 Type 2 diabetes mellitus with hyperglycemia: Secondary | ICD-10-CM

## 2022-01-16 NOTE — Telephone Encounter (Signed)
Chart supports Rx Last OV: 10/2021 Next OV: 01/2022 

## 2022-02-08 ENCOUNTER — Encounter: Payer: No Typology Code available for payment source | Admitting: Nurse Practitioner

## 2022-03-03 ENCOUNTER — Encounter (INDEPENDENT_AMBULATORY_CARE_PROVIDER_SITE_OTHER): Payer: BC Managed Care – PPO | Admitting: Nurse Practitioner

## 2022-03-03 DIAGNOSIS — E1165 Type 2 diabetes mellitus with hyperglycemia: Secondary | ICD-10-CM

## 2022-03-07 MED ORDER — MOUNJARO 2.5 MG/0.5ML ~~LOC~~ SOAJ: 2.5000 mg | SUBCUTANEOUS | 1 refills | Status: DC

## 2022-03-07 NOTE — Assessment & Plan Note (Signed)
>>  ASSESSMENT AND PLAN FOR TYPE 2 DIABETES MELLITUS WITH HYPERGLYCEMIA, WITHOUT LONG-TERM CURRENT USE OF INSULIN (HCC) WRITTEN ON 03/07/2022  3:10 PM BY Oretta Berkland LUM, NP  My current weight is 202. My fasting blood sugar readings are between 120-130  D/c ozempic  due to cost Start mounjaro  2.5mg  weekly F/up in 3months

## 2022-03-07 NOTE — Assessment & Plan Note (Signed)
"  My current weight is 202. My fasting blood sugar readings are between 120-130"  D/c ozempic due to cost Start mounjaro 2.5mg  weekly F/up in 31months

## 2022-03-07 NOTE — Telephone Encounter (Signed)
Please see the MyChart message reply(ies) for my assessment and plan.  The patient gave consent for this Medical Advice Message and is aware that it may result in a bill to their insurance company as well as the possibility that this may result in a co-payment or deductible. They are an established patient, but are not seeking medical advice exclusively about a problem treated during an in person or video visit in the last 7 days. I did not recommend an in person or video visit within 7 days of my reply.  I spent a total of 20minutes cumulative time within 7 days through MyChart messaging  Shaterra Sanzone, NP  

## 2022-03-09 ENCOUNTER — Other Ambulatory Visit: Payer: Self-pay

## 2022-03-09 DIAGNOSIS — Z3009 Encounter for other general counseling and advice on contraception: Secondary | ICD-10-CM

## 2022-03-09 DIAGNOSIS — Z3041 Encounter for surveillance of contraceptive pills: Secondary | ICD-10-CM

## 2022-03-09 MED ORDER — NORGESTIMATE-ETH ESTRADIOL 0.25-35 MG-MCG PO TABS: 1.0000 | ORAL_TABLET | Freq: Every day | ORAL | 0 refills | Status: DC

## 2022-03-13 DIAGNOSIS — E119 Type 2 diabetes mellitus without complications: Secondary | ICD-10-CM | POA: Diagnosis not present

## 2022-03-13 LAB — HM DIABETES EYE EXAM

## 2022-03-15 ENCOUNTER — Ambulatory Visit: Payer: No Typology Code available for payment source | Admitting: Registered"

## 2022-04-12 ENCOUNTER — Encounter: Payer: Self-pay | Admitting: Nurse Practitioner

## 2022-04-12 ENCOUNTER — Ambulatory Visit (INDEPENDENT_AMBULATORY_CARE_PROVIDER_SITE_OTHER): Payer: BC Managed Care – PPO | Admitting: Nurse Practitioner

## 2022-04-12 VITALS — BP 122/80 | HR 101 | Temp 97.2°F | Ht 65.5 in | Wt 206.0 lb

## 2022-04-12 DIAGNOSIS — Z0001 Encounter for general adult medical examination with abnormal findings: Secondary | ICD-10-CM

## 2022-04-12 DIAGNOSIS — R809 Proteinuria, unspecified: Secondary | ICD-10-CM | POA: Diagnosis not present

## 2022-04-12 DIAGNOSIS — I10 Essential (primary) hypertension: Secondary | ICD-10-CM | POA: Diagnosis not present

## 2022-04-12 DIAGNOSIS — E1169 Type 2 diabetes mellitus with other specified complication: Secondary | ICD-10-CM | POA: Diagnosis not present

## 2022-04-12 DIAGNOSIS — E785 Hyperlipidemia, unspecified: Secondary | ICD-10-CM | POA: Diagnosis not present

## 2022-04-12 DIAGNOSIS — Z23 Encounter for immunization: Secondary | ICD-10-CM | POA: Diagnosis not present

## 2022-04-12 LAB — TSH: TSH: 1.01 u[IU]/mL (ref 0.35–5.50)

## 2022-04-12 LAB — LIPID PANEL
Cholesterol: 152 mg/dL (ref 0–200)
HDL: 53.5 mg/dL (ref >39.00)
LDL Cholesterol: 59 mg/dL (ref 0–99)
NonHDL: 98.51
Total CHOL/HDL Ratio: 3
Triglycerides: 199 mg/dL — ABNORMAL HIGH (ref 0.0–149.0)
VLDL: 39.8 mg/dL (ref 0.0–40.0)

## 2022-04-12 LAB — CBC
HCT: 34.2 % — ABNORMAL LOW (ref 36.0–46.0)
Hemoglobin: 11.8 g/dL — ABNORMAL LOW (ref 12.0–15.0)
MCHC: 34.7 g/dL (ref 30.0–36.0)
MCV: 84.7 fl (ref 78.0–100.0)
Platelets: 418 10*3/uL — ABNORMAL HIGH (ref 150.0–400.0)
RBC: 4.03 Mil/uL (ref 3.87–5.11)
RDW: 13.6 % (ref 11.5–15.5)
WBC: 8 10*3/uL (ref 4.0–10.5)

## 2022-04-12 LAB — MICROALBUMIN / CREATININE URINE RATIO
Creatinine,U: 107.9 mg/dL
Microalb Creat Ratio: 1.1 mg/g (ref 0.0–30.0)
Microalb, Ur: 1.2 mg/dL (ref 0.0–1.9)

## 2022-04-12 MED ORDER — TIRZEPATIDE 5 MG/0.5ML ~~LOC~~ SOAJ: 5.0000 mg | SUBCUTANEOUS | 1 refills | Status: DC

## 2022-04-12 NOTE — Assessment & Plan Note (Signed)
>>  ASSESSMENT AND PLAN FOR TYPE 2 DIABETES MELLITUS WITH HYPERGLYCEMIA, WITHOUT LONG-TERM CURRENT USE OF INSULIN (HCC) WRITTEN ON 04/12/2022  1:51 PM BY Vardaan Depascale LUM, NP  Fasting glucose: 120s-130s No adverse effects with mounjaro  and metformin . DM eye exam completed: no DM retinopathy. ACE use at this time, repeat urine microalbumin. Unable to tolerate statin, so current use of zetia : repeat lipid panel. No neuropathy.  Repeat CMP Increase mounjaro  to 5mg  weekly Maintain metformin  dose F/up in 3-86months

## 2022-04-12 NOTE — Patient Instructions (Signed)
Go to lab Increase mounjaro to $RemoveBef'5mg'YumEPDupBv$  weekly Maintain other medication doses  Preventive Care 77-36 Years Old, Female Preventive care refers to lifestyle choices and visits with your health care provider that can promote health and wellness. Preventive care visits are also called wellness exams. What can I expect for my preventive care visit? Counseling During your preventive care visit, your health care provider may ask about your: Medical history, including: Past medical problems. Family medical history. Pregnancy history. Current health, including: Menstrual cycle. Method of birth control. Emotional well-being. Home life and relationship well-being. Sexual activity and sexual health. Lifestyle, including: Alcohol, nicotine or tobacco, and drug use. Access to firearms. Diet, exercise, and sleep habits. Work and work Statistician. Sunscreen use. Safety issues such as seatbelt and bike helmet use. Physical exam Your health care provider may check your: Height and weight. These may be used to calculate your BMI (body mass index). BMI is a measurement that tells if you are at a healthy weight. Waist circumference. This measures the distance around your waistline. This measurement also tells if you are at a healthy weight and may help predict your risk of certain diseases, such as type 2 diabetes and high blood pressure. Heart rate and blood pressure. Body temperature. Skin for abnormal spots. What immunizations do I need?  Vaccines are usually given at various ages, according to a schedule. Your health care provider will recommend vaccines for you based on your age, medical history, and lifestyle or other factors, such as travel or where you work. What tests do I need? Screening Your health care provider may recommend screening tests for certain conditions. This may include: Pelvic exam and Pap test. Lipid and cholesterol levels. Diabetes screening. This is done by checking your  blood sugar (glucose) after you have not eaten for a while (fasting). Hepatitis B test. Hepatitis C test. HIV (human immunodeficiency virus) test. STI (sexually transmitted infection) testing, if you are at risk. BRCA-related cancer screening. This may be done if you have a family history of breast, ovarian, tubal, or peritoneal cancers. Talk with your health care provider about your test results, treatment options, and if necessary, the need for more tests. Follow these instructions at home: Eating and drinking  Eat a healthy diet that includes fresh fruits and vegetables, whole grains, lean protein, and low-fat dairy products. Take vitamin and mineral supplements as recommended by your health care provider. Do not drink alcohol if: Your health care provider tells you not to drink. You are pregnant, may be pregnant, or are planning to become pregnant. If you drink alcohol: Limit how much you have to 0-1 drink a day. Know how much alcohol is in your drink. In the [place] one drink equals one 12 oz bottle of beer (355 mL), one 5 oz glass of wine (148 mL), or one 1 oz glass of hard liquor (44 mL). Lifestyle Brush your teeth every morning and night with fluoride toothpaste. Floss one time each day. Exercise for at least 30 minutes 5 or more days each week. Do not use any products that contain nicotine or tobacco. These products include cigarettes, chewing tobacco, and vaping devices, such as e-cigarettes. If you need help quitting, ask your health care provider. Do not use drugs. If you are sexually active, practice safe sex. Use a condom or other form of protection to prevent STIs. If you do not wish to become pregnant, use a form of birth control. If you plan to become pregnant, see your health care provider  for a prepregnancy visit. Find healthy ways to manage stress, such as: Meditation, yoga, or listening to music. Journaling. Talking to a trusted person. Spending time with friends and  family. Minimize exposure to UV radiation to reduce your risk of skin cancer. Safety Always wear your seat belt while driving or riding in a vehicle. Do not drive: If you have been drinking alcohol. Do not ride with someone who has been drinking. If you have been using any mind-altering substances or drugs. While texting. When you are tired or distracted. Wear a helmet and other protective equipment during sports activities. If you have firearms in your house, make sure you follow all gun safety procedures. Seek help if you have been physically or sexually abused. What's next? Go to your health care provider once a year for an annual wellness visit. Ask your health care provider how often you should have your eyes and teeth checked. Stay up to date on all vaccines. This information is not intended to replace advice given to you by your health care provider. Make sure you discuss any questions you have with your health care provider. Document Revised: 12/15/2020 Document Reviewed: 12/15/2020 Elsevier Patient Education  [place].

## 2022-04-12 NOTE — Assessment & Plan Note (Addendum)
Fasting glucose: 120s-130s No adverse effects with mounjaro and metformin. DM eye exam completed: no DM retinopathy. ACE use at this time, repeat urine microalbumin. Unable to tolerate statin, so current use of zetia: repeat lipid panel. No neuropathy.  Repeat CMP Increase mounjaro to 5mg  weekly Maintain metformin dose F/up in 3-98months

## 2022-04-12 NOTE — Assessment & Plan Note (Signed)
BP at goal with amlodipine and lisinopril BP Readings from Last 3 Encounters:  04/12/22 122/80  10/31/21 136/86  08/01/21 140/86   Maintain med doses

## 2022-04-12 NOTE — Progress Notes (Signed)
Complete physical exam  Patient: Laura Miller   DOB: 1986-03-17   36 y.o. Female  MRN: [id number] Visit Date: 04/12/2022  Subjective:    Chief Complaint  Patient presents with   Annual Exam    CPE -Fasting No concerns   Laura Miller is a 36 y.o. female who presents today for a complete physical exam. She reports consuming a low fat and low carb/low sugar  diet.  Cardio and weight training 4x/week  She generally feels well. She reports sleeping well. She does not have additional problems to discuss today.  Vision:Yes Dental:Yes STD Screen:No  Wt Readings from Last 3 Encounters:  04/12/22 206 lb (93.4 kg)  01/06/22 206 lb (93.4 kg)  10/31/21 213 lb 3.2 oz (96.7 kg)    BP Readings from Last 3 Encounters:  04/12/22 122/80  10/31/21 136/86  08/01/21 140/86    Most recent fall risk assessment:    01/14/2021    8:03 AM  Fall Risk   Falls in the past year? 0   Most recent depression screenings:    04/12/2022    1:33 PM 01/14/2021    8:03 AM  PHQ 2/9 Scores  PHQ - 2 Score 0 0  PHQ- 9 Score 0    HPI  Type 2 diabetes mellitus with hyperglycemia, without long-term current use of insulin (HCC) Fasting glucose: 120s-130s No adverse effects with mounjaro and metformin. DM eye exam completed: no DM retinopathy. ACE use at this time, repeat urine microalbumin. Unable to tolerate statin, so current use of zetia: repeat lipid panel. No neuropathy.  Repeat CMP Increase mounjaro to 5mg  weekly Maintain metformin dose F/up in 3-36months   HTN (hypertension), benign BP at goal with amlodipine and lisinopril BP Readings from Last 3 Encounters:  04/12/22 122/80  10/31/21 136/86  08/01/21 140/86   Maintain med doses   Past Medical History:  Diagnosis Date   Chicken pox    Diabetes mellitus type 2 in obese (HCC)    History of jaundice    infant   HTN (hypertension), benign    Pure hypercholesterolemia 08/26/2020   Past Surgical History:  Procedure Laterality  Date   NO PAST SURGERIES     Social History   Socioeconomic History   Marital status: Single    Spouse name: Not on file   Number of children: Not on file   Years of education: Not on file   Highest education level: Not on file  Occupational History   Occupation: Freight forwarder  Tobacco Use   Smoking status: Never   Smokeless tobacco: Never  Vaping Use   Vaping Use: Never used  Substance and Sexual Activity   Alcohol use: No    Alcohol/week: 0.0 standard drinks of alcohol   Drug use: No   Sexual activity: Yes    Partners: Male    Birth control/protection: Pill  Other Topics Concern   Not on file  Social History Narrative   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)   Has 2 sisters   Single, lives with her younger sister   No pets   Enjoys movies, taking a Tree surgeon Determinants of Radio broadcast assistant Strain: Not on file  Food Insecurity: Not on file  Transportation Needs: Not on file  Physical Activity: Not on file  Stress: Not on file  Social Connections: Not on file  Intimate Partner Violence: Not on file   Family Status  Relation Name Status   Father  Alive  MGM  Alive   MGF  Alive   PGM  Alive   PGF  Deceased   Mother  Alive   Sister Vonte Alive   Sister Gabriel Cirri Deceased   Family History  Problem Relation Age of Onset   Diabetes Father    Diabetes Maternal Grandmother    Hyperlipidemia Maternal Grandmother    Hyperlipidemia Maternal Grandfather    Diabetes Paternal Grandmother    Diabetes Paternal Grandfather    Diabetes Mother    Diabetes Sister    Allergies  Allergen Reactions   Pravastatin Itching and Rash    Patient Care Team: Lilyanna Lunt, Bonna Gains, NP as PCP - General (Internal Medicine) Corky Crafts, MD as PCP - Cardiology (Cardiology) Aisa Schoeppner, Bonna Gains, NP as Nurse Practitioner (Internal Medicine)   Medications: Outpatient Medications Prior to Visit  Medication Sig   amLODipine (NORVASC) 5 MG tablet Take 1  tablet (5 mg total) by mouth daily.   ezetimibe (ZETIA) 10 MG tablet Take 1 tablet (10 mg total) by mouth daily.   fluticasone (FLONASE) 50 MCG/ACT nasal spray Place 1 spray into both nostrils daily.   glucose blood (ONETOUCH VERIO) test strip USE TO CHECK BLOOD SUGAR ONCE A DAY   lisinopril (ZESTRIL) 2.5 MG tablet Take 1 tablet (2.5 mg total) by mouth daily.   metFORMIN (GLUCOPHAGE-XR) 750 MG 24 hr tablet Take 1 tablet (750 mg total) by mouth daily with breakfast.   norgestimate-ethinyl estradiol (ORTHO-CYCLEN) 0.25-35 MG-MCG tablet Take 1 tablet by mouth daily.   OneTouch Delica Lancets 33G MISC USE TO CHECK BLOOD SUGAR DAILY   Probiotic Product (PROBIOTIC DAILY) CAPS Take 1 capsule by mouth daily.   [DISCONTINUED] tirzepatide [facility]) 2.5 MG/0.5ML Pen Inject 2.5 mg into the skin once a week.   No facility-administered medications prior to visit.    Review of Systems  Constitutional:  Negative for fever.  HENT:  Negative for congestion and sore throat.   Eyes:        Negative for visual changes  Respiratory:  Negative for cough and shortness of breath.   Cardiovascular:  Negative for chest pain, palpitations and leg swelling.  Gastrointestinal:  Negative for blood in stool, constipation and diarrhea.  Genitourinary:  Negative for dysuria, frequency and urgency.  Musculoskeletal:  Negative for myalgias.  Skin:  Negative for rash.  Neurological:  Negative for dizziness and headaches.  Hematological:  Does not bruise/bleed easily.  Psychiatric/Behavioral:  Negative for suicidal ideas. The patient is not nervous/anxious.         Objective:  BP 122/80   Pulse (!) 101   Temp (!) 97.2 F (36.2 C) (Temporal)   Ht 5' 5.5" (1.664 m)   Wt 206 lb (93.4 kg)   SpO2 98%   BMI 33.76 kg/m       Physical Exam Vitals reviewed.  Constitutional:      General: She is not in acute distress.    Appearance: She is well-developed. She is obese.  HENT:     Right Ear: Tympanic membrane,  ear canal and external ear normal.     Left Ear: Tympanic membrane, ear canal and external ear normal.     Nose: Nose normal.  Eyes:     Extraocular Movements: Extraocular movements intact.     Conjunctiva/sclera: Conjunctivae normal.     Pupils: Pupils are equal, round, and reactive to light.  Cardiovascular:     Rate and Rhythm: Normal rate and regular rhythm.     Pulses: Normal pulses.  Heart sounds: Normal heart sounds.  Pulmonary:     Effort: Pulmonary effort is normal. No respiratory distress.     Breath sounds: Normal breath sounds.  Chest:     Chest wall: No tenderness.  Abdominal:     General: Bowel sounds are normal.     Palpations: Abdomen is soft.  Genitourinary:    Comments: Deferred breast and pelvic exam to GYN Musculoskeletal:        General: Normal range of motion.     Cervical back: Normal range of motion and neck supple.     Right lower leg: No edema.     Left lower leg: No edema.  Lymphadenopathy:     Cervical: No cervical adenopathy.  Skin:    General: Skin is warm and dry.  Neurological:     Mental Status: She is alert and oriented to person, place, and time.     Deep Tendon Reflexes: Reflexes are normal and symmetric.  Psychiatric:        Mood and Affect: Mood normal.        Behavior: Behavior normal.        Thought Content: Thought content normal.      Results for orders placed or performed in visit on 04/12/22  HM DIABETES EYE EXAM  Result Value Ref Range   HM Diabetic Eye Exam No Retinopathy No Retinopathy      Assessment & Plan:    Routine Health Maintenance and Physical Exam  Immunization History  Administered Date(s) Administered   Fluad Quad(high Dose 65+) 04/12/2022   HPV 9-valent 10/15/2017, 12/17/2017, 04/29/2018   Hepatitis B, adult 09/26/2013, 10/27/2013, 03/30/2014   Influenza,inj,Quad PF,6+ Mos 04/24/2017, 03/25/2018, 03/19/2019   Influenza-Unspecified 04/10/2020, 04/06/2021   PPD Test 02/06/2012   Pneumococcal  Polysaccharide-23 08/13/2017   Tdap 05/18/2017    Health Maintenance  Topic Date Due   Diabetic kidney evaluation - Urine ACR  01/06/2022   HEMOGLOBIN A1C  05/03/2022   Diabetic kidney evaluation - GFR measurement  08/01/2022   FOOT EXAM  11/01/2022   OPHTHALMOLOGY EXAM  03/14/2023   PAP SMEAR-Modifier  01/27/2024   TETANUS/TDAP  05/19/2027   INFLUENZA VACCINE  Completed   HPV VACCINES  Completed   Hepatitis C Screening  Completed   HIV Screening  Completed   COVID-19 Vaccine  Discontinued    Discussed health benefits of physical activity, and encouraged her to engage in regular exercise appropriate for her age and condition.  Problem List Items Addressed This Visit       Cardiovascular and Mediastinum   HTN (hypertension), benign    BP at goal with amlodipine and lisinopril BP Readings from Last 3 Encounters:  04/12/22 122/80  10/31/21 136/86  08/01/21 140/86   Maintain med doses        Endocrine   Hyperlipidemia associated with type 2 diabetes mellitus (HCC)   Relevant Medications   tirzepatide (MOUNJARO) 5 MG/0.5ML Pen   Other Relevant Orders   Lipid panel   Type 2 diabetes mellitus with hyperglycemia, without long-term current use of insulin (HCC)    Fasting glucose: 120s-130s No adverse effects with mounjaro and metformin. DM eye exam completed: no DM retinopathy. ACE use at this time, repeat urine microalbumin. Unable to tolerate statin, so current use of zetia: repeat lipid panel. No neuropathy.  Repeat CMP Increase mounjaro to 5mg  weekly Maintain metformin dose F/up in 3-43months       Relevant Medications   tirzepatide (MOUNJARO) 5 MG/0.5ML Pen  Other   Microalbuminuria   Other Visit Diagnoses     Encounter for preventative adult health care exam with abnormal findings    -  Primary   Relevant Orders   TSH   CBC   Need for influenza vaccination       Relevant Orders   Flu Vaccine QUAD High Dose(Fluad) (Completed)      Return in  about 6 months (around 10/12/2022) for DM, HTN, hyperlipidemia (fasting).     [name], NP

## 2022-04-13 LAB — COMPLETE METABOLIC PANEL WITH GFR
AG Ratio: 1.1 (calc) (ref 1.0–2.5)
ALT: 17 U/L (ref 6–29)
AST: 15 U/L (ref 10–30)
Albumin: 4.1 g/dL (ref 3.6–5.1)
Alkaline phosphatase (APISO): 60 U/L (ref 31–125)
BUN: 12 mg/dL (ref 7–25)
CO2: 23 mmol/L (ref 20–32)
Calcium: 10 mg/dL (ref 8.6–10.2)
Chloride: 101 mmol/L (ref 98–110)
Creat: 0.83 mg/dL (ref 0.50–0.97)
Globulin: 3.6 g/dL (calc) (ref 1.9–3.7)
Glucose, Bld: 113 mg/dL — ABNORMAL HIGH (ref 65–99)
Potassium: 3.9 mmol/L (ref 3.5–5.3)
Sodium: 138 mmol/L (ref 135–146)
Total Bilirubin: 0.2 mg/dL (ref 0.2–1.2)
Total Protein: 7.7 g/dL (ref 6.1–8.1)
eGFR: 94 mL/min/{1.73_m2} (ref >=60)

## 2022-04-17 ENCOUNTER — Other Ambulatory Visit (INDEPENDENT_AMBULATORY_CARE_PROVIDER_SITE_OTHER): Payer: BC Managed Care – PPO

## 2022-04-17 DIAGNOSIS — E1169 Type 2 diabetes mellitus with other specified complication: Secondary | ICD-10-CM | POA: Diagnosis not present

## 2022-04-17 LAB — HEMOGLOBIN A1C: Hgb A1c MFr Bld: 6.9 % — ABNORMAL HIGH (ref 4.6–6.5)

## 2022-04-17 NOTE — Addendum Note (Signed)
Addended by: Lynnea Ferrier on: 04/17/2022 09:48 AM   Modules accepted: Orders

## 2022-04-26 ENCOUNTER — Encounter: Payer: BC Managed Care – PPO | Attending: Nurse Practitioner | Admitting: Registered"

## 2022-04-26 ENCOUNTER — Encounter: Payer: Self-pay | Admitting: Registered"

## 2022-04-26 VITALS — Wt 207.0 lb

## 2022-04-26 DIAGNOSIS — E1165 Type 2 diabetes mellitus with hyperglycemia: Secondary | ICD-10-CM | POA: Diagnosis not present

## 2022-04-26 NOTE — Patient Instructions (Addendum)
When waking up during the night consider ways to relax to go back to sleep such as sound machine, meditation, etc.  15-30 grams protein at each meal  To help with fasting blood sugar try some exercise sometime after dinner Continue to have dinner by 7 pm  If you continue to wake up at 4 am, consider checking blood sugar when up also before bed and see if there is a pattern.

## 2022-04-26 NOTE — Progress Notes (Signed)
Diabetes Self-Management Education  Visit Type: Follow-up  Appt. Start Time: 1150 Appt. End Time: 1220  04/26/2022  [name], identified by name and date of birth, is a 36 y.o. female with a diagnosis of Diabetes: Type 2.   ASSESSMENT  Body Composition Scale Date 01/06/22  04/26/22  Current Body Weight 206 207  Total Body Fat % 38.4 38.5  Visceral Fat 1-9 = healthy 10-4 = high 15+ = very high 10 10  Fat-Free Mass % 61.5 61.4   Total Body Water %    Muscle-Mass lbs 45.2 45.2  BMI 33.9 34.1  Body Fat Displacement           Torso  lbs 48.9 49.4         Left Leg  lbs 9.7 9.8         Right Leg  lbs 9.7 9.8         Left Arm  lbs 4.8 4.9         Right Arm   lbs 4.8 4.9    A1c 6.9% down from 7.3% 3 mo ago Metformin 750 mg; Mounjaro 5 mg   SMBG: FBS 120-130 mg/dL PPBG: 976-734 mg/dL (stopped checking because PPBG are WNL) Pt states her MD wants her FBS to be around 80 mg/dL and has changed her medication and increased to help reach this goal.  Sleep: 7-8 hrs, is pretty good lately except waking up at 4 am recently. Stress: 3/10, Pt states new job is going well.  Diet: not likely getting enough protein. Body comp has stayed the same since July. Muscle mass has not increased   Physical activity: 3-4x/week still doing plan set up by personal trainer and increased steps to  4-5,000 daily.    Diabetes Self-Management Education - 04/26/22 1700       Visit Information   Visit Type Follow-up      Complications   Last HgB A1C per patient/outside source 6.9 %    How often do you check your blood sugar? 1-2 times/day    Fasting Blood glucose range (mg/dL) 19-379   024-097   Postprandial Blood glucose range (mg/dL) 35-329      Dietary Intake   Breakfast Honey nut cheeriso    Lunch Che salad, fruit or fruit cup    Dinner chicken Metallurgist) mostly water, diet green tea, occassional sweet/unsweet tea      Activity / Exercise   Activity / Exercise Type  Moderate (swimming / aerobic walking)    How many days per week do you exercise? 4    How many minutes per day do you exercise? 60    Total minutes per week of exercise 240      Patient Education   Being Active Other (comment)   role of protein for muscle building   Monitoring Purpose and frequency of SMBG.      Individualized Goals (developed by patient)   Nutrition Follow meal plan discussed    Physical Activity Exercise 3-5 times per week   in the evening   Monitoring  Test my blood glucose as discussed      Patient Self-Evaluation of Goals - Patient rates self as meeting previously set goals (% of time)   Nutrition < 25% (hardly ever/never)    Physical Activity >75% (most of the time)      Outcomes   Expected Outcomes Demonstrated interest in learning but significant barriers to change;Demonstrated interest in learning. Expect positive outcomes  Future DMSE PRN    Program Status Completed      Subsequent Visit   Since your last visit have you experienced any weight changes? No change             Individualized Plan for Diabetes Self-Management Training:   Learning Objective:  Patient will have a greater understanding of diabetes self-management. Patient education plan is to attend individual and/or group sessions per assessed needs and concerns.  Patient Instructions  When waking up during the night consider ways to relax to go back to sleep such as sound machine, meditation, etc.  15-30 grams protein at each meal  To help with fasting blood sugar try some exercise sometime after dinner Continue to have dinner by 7 pm  If you continue to wake up at 4 am, consider checking blood sugar when up also before bed and see if there is a pattern.   Expected Outcomes:  Demonstrated interest in learning but significant barriers to change, Demonstrated interest in learning. Expect positive outcomes  Education material provided: none  If problems or questions, patient to  contact team via:  Phone and MyChart  Future DSME appointment: PRN

## 2022-05-11 ENCOUNTER — Telehealth: Payer: Self-pay | Admitting: Nurse Practitioner

## 2022-05-11 NOTE — Telephone Encounter (Signed)
Pt want a call back. About switching medication

## 2022-05-11 NOTE — Telephone Encounter (Signed)
Called & spoke w. Pt, says the mounjaro is too expensive for her this time. Says she called her insurance covers the Dexcom and [employer], I explained to her that those are devices that are used to monitor her blood sugars. Says she will call her insurance back to see which alternative medication they cover.

## 2022-05-17 ENCOUNTER — Encounter: Payer: Self-pay | Admitting: Obstetrics & Gynecology

## 2022-05-17 ENCOUNTER — Other Ambulatory Visit (HOSPITAL_COMMUNITY)
Admission: RE | Admit: 2022-05-17 | Discharge: 2022-05-17 | Disposition: A | Discharge status: Home / Self Care | Payer: BC Managed Care – PPO | Source: Ambulatory Visit | Attending: Obstetrics & Gynecology | Admitting: Obstetrics & Gynecology

## 2022-05-17 ENCOUNTER — Ambulatory Visit (INDEPENDENT_AMBULATORY_CARE_PROVIDER_SITE_OTHER): Payer: BC Managed Care – PPO | Admitting: Obstetrics & Gynecology

## 2022-05-17 VITALS — BP 138/85 | HR 103 | Ht 65.0 in | Wt 208.0 lb

## 2022-05-17 DIAGNOSIS — Z01419 Encounter for gynecological examination (general) (routine) without abnormal findings: Secondary | ICD-10-CM

## 2022-05-17 DIAGNOSIS — Z113 Encounter for screening for infections with a predominantly sexual mode of transmission: Secondary | ICD-10-CM | POA: Insufficient documentation

## 2022-05-17 DIAGNOSIS — Z3041 Encounter for surveillance of contraceptive pills: Secondary | ICD-10-CM

## 2022-05-17 DIAGNOSIS — Z3009 Encounter for other general counseling and advice on contraception: Secondary | ICD-10-CM

## 2022-05-17 MED ORDER — NORGESTIMATE-ETH ESTRADIOL 0.25-35 MG-MCG PO TABS: 1.0000 | ORAL_TABLET | Freq: Every day | ORAL | 5 refills | Status: DC

## 2022-05-17 NOTE — Progress Notes (Signed)
Subjective:     Laura Miller is a 36 y.o. female here for a routine exam. G0. Cycles every 4 weeks. Not heavy. Pt is on OCPs for contraception. Current complaints: none. Sexually active on OCPs. No FH of any female malignancies.   Gynecologic History Patient's last menstrual period was 04/30/2022 (exact date). Contraception: OCP (estrogen/progesterone) Last Pap: 01/26/2021. Results were: normal Last mammogram: n/a.   Obstetric History OB History  Gravida Para Term Preterm AB Living  0 0 0 0 0 0  SAB IAB Ectopic Multiple Live Births  0 0 0 0 0   The following portions of the patient's history were reviewed and updated as appropriate: allergies, current medications, past family history, past medical history, past social history, past surgical history, and problem list.  Review of Systems Pertinent items are noted in HPI.    Objective:  BP 138/85   Pulse (!) 103   Ht 5\' 5"  (1.651 m)   Wt 208 lb (94.3 kg)   LMP 04/30/2022 (Exact Date)   BMI 34.61 kg/m   General Appearance:    Alert, cooperative, no distress, appears stated age  Head:    Normocephalic, without obvious abnormality, atraumatic  Eyes:    conjunctiva/corneas clear, EOM's intact, both eyes  Ears:    Normal external ear canals, both ears  Nose:   Nares normal, septum midline, mucosa normal, no drainage    or sinus tenderness  Throat:   Lips, mucosa, and tongue normal; teeth and gums normal  Neck:   Supple, symmetrical, trachea midline, no adenopathy;    thyroid:  no enlargement/tenderness/nodules  Back:     Symmetric, no curvature, ROM normal, no CVA tenderness  Lungs:     respirations unlabored  Chest Wall:    No tenderness or deformity   Heart:    Regular rate and rhythm  Breast Exam:    No tenderness, masses, or nipple abnormality  Abdomen:     Soft, non-tender, bowel sounds active all four quadrants,    no masses, no organomegaly  Genitalia:    Normal female without lesion, discharge or tenderness      Extremities:   Extremities normal, atraumatic, no cyanosis or edema  Pulses:   2+ and symmetric all extremities  Skin:   Skin color, texture, turgor normal, no rashes or lesions     Assessment:    Healthy female exam.  Contraception counseling  STI screen    Plan:   Diagnoses and all orders for this visit:  Well female exam with routine gynecological exam -     Cervicovaginal ancillary only( [place])  Screening for STD (sexually transmitted disease) -     Cervicovaginal ancillary only( [employer])  Encounter for surveillance of contraceptive pills -     norgestimate-ethinyl estradiol (ORTHO-CYCLEN) 0.25-35 MG-MCG tablet; Take 1 tablet by mouth daily.  Encounter for counseling regarding contraception -     norgestimate-ethinyl estradiol (ORTHO-CYCLEN) 0.25-35 MG-MCG tablet; Take 1 tablet by mouth daily.   F/u in 1 year or sooner prn   Oshay Stranahan L. Harraway-Smith, M.D., 03-19-1999

## 2022-05-19 LAB — CERVICOVAGINAL ANCILLARY ONLY
Bacterial Vaginitis (gardnerella): NEGATIVE
Candida Glabrata: NEGATIVE
Candida Vaginitis: NEGATIVE
Chlamydia: NEGATIVE
Comment: NEGATIVE
Comment: NEGATIVE
Comment: NEGATIVE
Comment: NEGATIVE
Comment: NEGATIVE
Comment: NORMAL
Neisseria Gonorrhea: NEGATIVE
Trichomonas: NEGATIVE

## 2022-06-07 ENCOUNTER — Telehealth: Payer: Self-pay | Admitting: Nurse Practitioner

## 2022-06-07 DIAGNOSIS — E1165 Type 2 diabetes mellitus with hyperglycemia: Secondary | ICD-10-CM

## 2022-06-07 NOTE — Telephone Encounter (Signed)
Caller Name: Zonie Crutcher Call back phone #: [phone]  Reason for Call: Pt is asking for a prescription for one touch verio test strips and needles that go with. Please send to  [id number] - [place], [place] - [address] AT [facility] [address]  Phone:[phone]

## 2022-06-08 MED ORDER — ONETOUCH VERIO VI STRP: ORAL_STRIP | 3 refills | Status: DC

## 2022-06-08 MED ORDER — ONETOUCH DELICA LANCETS 33G MISC: 3 refills | Status: DC

## 2022-06-23 ENCOUNTER — Telehealth: Payer: Self-pay | Admitting: Nurse Practitioner

## 2022-06-23 NOTE — Telephone Encounter (Signed)
Medical records sent ?

## 2022-06-23 NOTE — Telephone Encounter (Signed)
Kristy  from [employer] called and wanted to know if you have the pt medical records . Please call her at [phone]. ( I sent to med records twice last week and scanned into pt chart )

## 2022-07-12 ENCOUNTER — Ambulatory Visit: Payer: BC Managed Care – PPO | Admitting: Registered"

## 2022-08-07 ENCOUNTER — Other Ambulatory Visit: Payer: Self-pay | Admitting: Nurse Practitioner

## 2022-08-07 DIAGNOSIS — I10 Essential (primary) hypertension: Secondary | ICD-10-CM

## 2022-08-17 ENCOUNTER — Other Ambulatory Visit: Payer: Self-pay | Admitting: Nurse Practitioner

## 2022-08-17 DIAGNOSIS — E1165 Type 2 diabetes mellitus with hyperglycemia: Secondary | ICD-10-CM

## 2022-08-17 DIAGNOSIS — E7800 Pure hypercholesterolemia, unspecified: Secondary | ICD-10-CM

## 2022-08-24 ENCOUNTER — Other Ambulatory Visit: Payer: Self-pay | Admitting: Nurse Practitioner

## 2022-08-24 DIAGNOSIS — E1165 Type 2 diabetes mellitus with hyperglycemia: Secondary | ICD-10-CM

## 2022-08-24 DIAGNOSIS — R809 Proteinuria, unspecified: Secondary | ICD-10-CM

## 2022-08-25 ENCOUNTER — Other Ambulatory Visit: Payer: Self-pay

## 2022-08-25 DIAGNOSIS — R809 Proteinuria, unspecified: Secondary | ICD-10-CM

## 2022-08-25 DIAGNOSIS — E1165 Type 2 diabetes mellitus with hyperglycemia: Secondary | ICD-10-CM

## 2022-08-25 MED ORDER — METFORMIN HCL ER 750 MG PO TB24: 750.0000 mg | ORAL_TABLET | Freq: Every day | ORAL | 3 refills | Status: DC

## 2022-10-11 ENCOUNTER — Encounter: Payer: Self-pay | Admitting: Nurse Practitioner

## 2022-10-11 ENCOUNTER — Ambulatory Visit (INDEPENDENT_AMBULATORY_CARE_PROVIDER_SITE_OTHER): Payer: BC Managed Care – PPO | Admitting: Nurse Practitioner

## 2022-10-11 VITALS — BP 120/70 | HR 102 | Temp 97.2°F | Resp 16 | Ht 65.0 in | Wt 209.8 lb

## 2022-10-11 DIAGNOSIS — E6609 Other obesity due to excess calories: Secondary | ICD-10-CM | POA: Diagnosis not present

## 2022-10-11 DIAGNOSIS — R809 Proteinuria, unspecified: Secondary | ICD-10-CM | POA: Diagnosis not present

## 2022-10-11 DIAGNOSIS — E1169 Type 2 diabetes mellitus with other specified complication: Secondary | ICD-10-CM | POA: Diagnosis not present

## 2022-10-11 DIAGNOSIS — I10 Essential (primary) hypertension: Secondary | ICD-10-CM | POA: Diagnosis not present

## 2022-10-11 DIAGNOSIS — Z6834 Body mass index (BMI) 34.0-34.9, adult: Secondary | ICD-10-CM

## 2022-10-11 LAB — POCT GLYCOSYLATED HEMOGLOBIN (HGB A1C): HbA1c, POC (controlled diabetic range): 7.8 % — AB (ref 0.0–7.0)

## 2022-10-11 MED ORDER — GLIPIZIDE ER 5 MG PO TB24: 5.0000 mg | ORAL_TABLET | Freq: Every day | ORAL | 1 refills | Status: DC

## 2022-10-11 NOTE — Progress Notes (Signed)
Established Patient Visit  Patient: Laura Miller   DOB: 1986/04/05   37 y.o. Female  MRN: [id number] Visit Date: 10/11/2022  Subjective:    Chief Complaint  Patient presents with   Medical Management of Chronic Issues    Fasting- Yes    HPI Microalbuminuria Resolved with use of lisinopril 2.5mg   Type 2 diabetes mellitus with hyperglycemia, without long-term current use of insulin (HCC) Unable to afford ozempic, mounjaro, jardiance and farxiga (high deductible) Repeat hgbA1c at 7.8% (increased).  Advised about need for diet modification and daily exercise. Maintain metformin dose Add glipizide 5mg  XR F/up in 6month  Obesity Advised about need for Heart healthy diet and daily exercise. Wt Readings from Last 3 Encounters:  10/11/22 209 lb 12.8 oz (95.2 kg)  05/17/22 208 lb (94.3 kg)  04/26/22 207 lb (93.9 kg)     HTN (hypertension), benign BP at goal with amlodipine and lisinopril BP Readings from Last 3 Encounters:  10/11/22 120/70  05/17/22 138/85  04/12/22 122/80    Maintain med doses F/up in 47months  Wt Readings from Last 3 Encounters:  10/11/22 209 lb 12.8 oz (95.2 kg)  05/17/22 208 lb (94.3 kg)  04/26/22 207 lb (93.9 kg)    Reviewed medical, surgical, and social history today  Medications: Outpatient Medications Prior to Visit  Medication Sig   amLODipine (NORVASC) 5 MG tablet TAKE 1 TABLET(5 MG) BY MOUTH DAILY   ezetimibe (ZETIA) 10 MG tablet TAKE 1 TABLET(10 MG) BY MOUTH DAILY   fluticasone (FLONASE) 50 MCG/ACT nasal spray Place 1 spray into both nostrils daily.   glucose blood (ONETOUCH VERIO) test strip USE TO CHECK BLOOD SUGAR ONCE A DAY   lisinopril (ZESTRIL) 2.5 MG tablet TAKE 1 TABLET(2.5 MG) BY MOUTH DAILY   metFORMIN (GLUCOPHAGE-XR) 750 MG 24 hr tablet Take 1 tablet (750 mg total) by mouth daily with breakfast.   norgestimate-ethinyl estradiol (ORTHO-CYCLEN) 0.25-35 MG-MCG tablet Take 1 tablet by mouth daily.   OneTouch Delica  Lancets 33G MISC USE TO CHECK BLOOD SUGAR DAILY   Probiotic Product (PROBIOTIC DAILY) CAPS Take 1 capsule by mouth daily.   [DISCONTINUED] tirzepatide [facility]) 5 MG/0.5ML Pen Inject 5 mg into the skin once a week.   No facility-administered medications prior to visit.   Reviewed past medical and social history.   ROS per HPI above      Objective:  BP 120/70   Pulse (!) 102   Temp (!) 97.2 F (36.2 C) (Temporal)   Resp 16   Ht 5\' 5"  (1.651 m)   Wt 209 lb 12.8 oz (95.2 kg)   LMP 09/17/2022 (Approximate)   SpO2 99%   BMI 34.91 kg/m      Physical Exam Cardiovascular:     Rate and Rhythm: Normal rate and regular rhythm.     Pulses: Normal pulses.     Heart sounds: Normal heart sounds.  Pulmonary:     Effort: Pulmonary effort is normal.     Breath sounds: Normal breath sounds.  Neurological:     Mental Status: She is alert and oriented to person, place, and time.     Results for orders placed or performed in visit on 10/11/22  POCT glycosylated hemoglobin (Hb A1C)  Result Value Ref Range   Hemoglobin A1C (A)    HbA1c POC (<> result, manual entry)     HbA1c, POC (prediabetic range)     HbA1c, POC (controlled  diabetic range) 7.8 (A) 0.0 - 7.0 %      Assessment & Plan:    Problem List Items Addressed This Visit       Cardiovascular and Mediastinum   HTN (hypertension), benign    BP at goal with amlodipine and lisinopril BP Readings from Last 3 Encounters:  10/11/22 120/70  05/17/22 138/85  04/12/22 122/80    Maintain med doses F/up in 62months        Endocrine   Type 2 diabetes mellitus with hyperglycemia, without long-term current use of insulin - Primary    Unable to afford ozempic, mounjaro, jardiance and farxiga (high deductible) Repeat hgbA1c at 7.8% (increased).  Advised about need for diet modification and daily exercise. Maintain metformin dose Add glipizide 5mg  XR F/up in 31month      Relevant Medications   glipiZIDE (GLUCOTROL XL) 5 MG 24  hr tablet     Other   Microalbuminuria    Resolved with use of lisinopril 2.5mg       Obesity    Advised about need for Heart healthy diet and daily exercise. Wt Readings from Last 3 Encounters:  10/11/22 209 lb 12.8 oz (95.2 kg)  05/17/22 208 lb (94.3 kg)  04/26/22 207 lb (93.9 kg)         Relevant Medications   glipiZIDE (GLUCOTROL XL) 5 MG 24 hr tablet   Return in about 3 months (around 01/10/2023) for HTN, DM, hyperlipidemia (fasting).     [name], NP

## 2022-10-11 NOTE — Assessment & Plan Note (Signed)
Advised about need for Heart healthy diet and daily exercise. Wt Readings from Last 3 Encounters:  10/11/22 209 lb 12.8 oz (95.2 kg)  05/17/22 208 lb (94.3 kg)  04/26/22 207 lb (93.9 kg)

## 2022-10-11 NOTE — Assessment & Plan Note (Signed)
Unable to afford ozempic, mounjaro, jardiance and farxiga (high deductible) Repeat hgbA1c at 7.8% (increased).  Advised about need for diet modification and daily exercise. Maintain metformin dose Add glipizide 5mg  XR F/up in 9month

## 2022-10-11 NOTE — Assessment & Plan Note (Signed)
>>  ASSESSMENT AND PLAN FOR TYPE 2 DIABETES MELLITUS WITH HYPERGLYCEMIA, WITHOUT LONG-TERM CURRENT USE OF INSULIN (HCC) WRITTEN ON 10/11/2022  8:43 AM BY Zadyn Yardley LUM, NP  Unable to afford ozempic , mounjaro , jardiance and farxiga (high deductible) Repeat hgbA1c at 7.8% (increased).  Advised about need for diet modification and daily exercise. Maintain metformin  dose Add glipizide  5mg  XR F/up in 26month

## 2022-10-11 NOTE — Assessment & Plan Note (Signed)
Resolved with use of lisinopril 2.5mg 

## 2022-10-11 NOTE — Assessment & Plan Note (Signed)
BP at goal with amlodipine and lisinopril BP Readings from Last 3 Encounters:  10/11/22 120/70  05/17/22 138/85  04/12/22 122/80    Maintain med doses F/up in 75months

## 2022-10-11 NOTE — Patient Instructions (Signed)
Start glipizide Maintain other medication doses Continue Heart healthy diet and daily exercise.

## 2023-01-17 ENCOUNTER — Ambulatory Visit: Payer: BC Managed Care – PPO | Admitting: Nurse Practitioner

## 2023-01-17 ENCOUNTER — Encounter: Payer: Self-pay | Admitting: Nurse Practitioner

## 2023-01-17 VITALS — BP 130/89 | HR 110 | Temp 98.0°F | Resp 16 | Ht 65.0 in | Wt 213.0 lb

## 2023-01-17 DIAGNOSIS — E1165 Type 2 diabetes mellitus with hyperglycemia: Secondary | ICD-10-CM

## 2023-01-17 DIAGNOSIS — Z7984 Long term (current) use of oral hypoglycemic drugs: Secondary | ICD-10-CM

## 2023-01-17 DIAGNOSIS — E1169 Type 2 diabetes mellitus with other specified complication: Secondary | ICD-10-CM | POA: Diagnosis not present

## 2023-01-17 DIAGNOSIS — E785 Hyperlipidemia, unspecified: Secondary | ICD-10-CM | POA: Diagnosis not present

## 2023-01-17 LAB — LIPID PANEL
Cholesterol: 165 mg/dL (ref 0–200)
HDL: 51.7 mg/dL (ref >39.00)
NonHDL: 113.01
Total CHOL/HDL Ratio: 3
Triglycerides: 297 mg/dL — ABNORMAL HIGH (ref 0.0–149.0)
VLDL: 59.4 mg/dL — ABNORMAL HIGH (ref 0.0–40.0)

## 2023-01-17 LAB — LDL CHOLESTEROL, DIRECT: Direct LDL: 79 mg/dL

## 2023-01-17 LAB — HEMOGLOBIN A1C: Hgb A1c MFr Bld: 8.9 % — ABNORMAL HIGH (ref 4.6–6.5)

## 2023-01-17 MED ORDER — FENOFIBRATE 160 MG PO TABS
160.0000 mg | ORAL_TABLET | Freq: Every day | ORAL | 3 refills | Status: DC
Start: 2023-01-17 — End: 2023-10-07

## 2023-01-17 MED ORDER — METFORMIN HCL ER 750 MG PO TB24
750.0000 mg | ORAL_TABLET | Freq: Two times a day (BID) | ORAL | 3 refills | Status: DC
Start: 2023-01-17 — End: 2023-11-08

## 2023-01-17 NOTE — Assessment & Plan Note (Signed)
Uncontrolled with metformin 750mg  every day and glipizide 5mg  every day Repeat hgbA1c: Increase to 8.9% Increase metformin to 750mg  BID. Maintain glipizide dose. F/up in 3months

## 2023-01-17 NOTE — Patient Instructions (Signed)
Go to lab Maintain current med doses 

## 2023-01-17 NOTE — Progress Notes (Signed)
Established Patient Visit  Patient: Laura Miller   DOB: [birth year]   37 y.o. Female  MRN: [id number] Visit Date: 01/17/2023  Subjective:    Chief Complaint  Patient presents with   Medical Management of Chronic Issues    Fasting    HPI Hyperlipidemia associated with type 2 diabetes mellitus (HCC) LDL not at goal with zetia Allergic reaction to pravastatin Admits to non compliance with diet and exercise. Repeat lipid panel: elevated triglyceride Stop zetia and start fenofibrate Repeat lipid panel in 1-64months (fasting)  Type 2 diabetes mellitus with hyperglycemia, without long-term current use of insulin (HCC) Uncontrolled with metformin 750mg  every day and glipizide 5mg  every day Repeat hgbA1c: Increase to 8.9% Increase metformin to 750mg  BID. Maintain glipizide dose. F/up in 3months  Wt Readings from Last 3 Encounters:  01/17/23 213 lb (96.6 kg)  10/11/22 209 lb 12.8 oz (95.2 kg)  05/17/22 208 lb (94.3 kg)    BP Readings from Last 3 Encounters:  01/17/23 130/89  10/11/22 120/70  05/17/22 138/85    Reviewed medical, surgical, and social history today  Medications: Outpatient Medications Prior to Visit  Medication Sig   amLODipine (NORVASC) 5 MG tablet TAKE 1 TABLET(5 MG) BY MOUTH DAILY   fluticasone (FLONASE) 50 MCG/ACT nasal spray Place 1 spray into both nostrils daily.   glipiZIDE (GLUCOTROL XL) 5 MG 24 hr tablet Take 1 tablet (5 mg total) by mouth daily with breakfast.   glucose blood (ONETOUCH VERIO) test strip USE TO CHECK BLOOD SUGAR ONCE A DAY   lisinopril (ZESTRIL) 2.5 MG tablet TAKE 1 TABLET(2.5 MG) BY MOUTH DAILY   norgestimate-ethinyl estradiol (ORTHO-CYCLEN) 0.25-35 MG-MCG tablet Take 1 tablet by mouth daily.   OneTouch Delica Lancets 33G MISC USE TO CHECK BLOOD SUGAR DAILY   Probiotic Product (PROBIOTIC DAILY) CAPS Take 1 capsule by mouth daily.   [DISCONTINUED] ezetimibe (ZETIA) 10 MG tablet TAKE 1 TABLET(10 MG) BY MOUTH DAILY    [DISCONTINUED] metFORMIN (GLUCOPHAGE-XR) 750 MG 24 hr tablet Take 1 tablet (750 mg total) by mouth daily with breakfast.   No facility-administered medications prior to visit.   Reviewed past medical and social history.   ROS per HPI above      Objective:  BP 130/89 (BP Location: Left Arm, Patient Position: Sitting, Cuff Size: Large)   Pulse (!) 110   Temp 98 F (36.7 C)   Resp 16   Ht 5\' 5"  (1.651 m)   Wt 213 lb (96.6 kg)   LMP 01/06/2023   SpO2 98%   BMI 35.45 kg/m      Physical Exam  Results for orders placed or performed in visit on 01/17/23  Lipid panel  Result Value Ref Range   Cholesterol 165 0 - 200 mg/dL   Triglycerides 536.6 (H) 0.0 - 149.0 mg/dL   HDL 44.03 >47.42 mg/dL   VLDL 59.5 (H) 0.0 - 63.8 mg/dL   Total CHOL/HDL Ratio 3    NonHDL 113.01   Hemoglobin A1c  Result Value Ref Range   Hgb A1c MFr Bld 8.9 (H) 4.6 - 6.5 %  LDL cholesterol, direct  Result Value Ref Range   Direct LDL 79.0 mg/dL      Assessment & Plan:    Problem List Items Addressed This Visit       Endocrine   Hyperlipidemia associated with type 2 diabetes mellitus (HCC)    LDL not at goal with zetia Allergic  reaction to pravastatin Admits to non compliance with diet and exercise. Repeat lipid panel: elevated triglyceride Stop zetia and start fenofibrate Repeat lipid panel in 1-17months (fasting)      Relevant Medications   fenofibrate 160 MG tablet   metFORMIN (GLUCOPHAGE-XR) 750 MG 24 hr tablet   Other Relevant Orders   Lipid panel (Completed)   Type 2 diabetes mellitus with hyperglycemia, without long-term current use of insulin (HCC) - Primary    Uncontrolled with metformin 750mg  every day and glipizide 5mg  every day Repeat hgbA1c: Increase to 8.9% Increase metformin to 750mg  BID. Maintain glipizide dose. F/up in 3months      Relevant Medications   metFORMIN (GLUCOPHAGE-XR) 750 MG 24 hr tablet   Other Relevant Orders   Hemoglobin A1c (Completed)   Return in about  3 months (around 04/19/2023) for HTN, DM, hyperlipidemia (fasting).     [name], NP

## 2023-01-17 NOTE — Assessment & Plan Note (Signed)
LDL not at goal with zetia Allergic reaction to pravastatin Admits to non compliance with diet and exercise. Repeat lipid panel: elevated triglyceride Stop zetia and start fenofibrate Repeat lipid panel in 1-26months (fasting)

## 2023-01-17 NOTE — Assessment & Plan Note (Signed)
>>  ASSESSMENT AND PLAN FOR TYPE 2 DIABETES MELLITUS WITH HYPERGLYCEMIA, WITHOUT LONG-TERM CURRENT USE OF INSULIN (HCC) WRITTEN ON 01/17/2023 12:31 PM BY Ramello Cordial LUM, NP  Uncontrolled with metformin  750mg  every day and glipizide  5mg  every day Repeat hgbA1c: Increase to 8.9% Increase metformin  to 750mg  BID. Maintain glipizide  dose. F/up in 3months

## 2023-04-04 ENCOUNTER — Other Ambulatory Visit: Payer: Self-pay | Admitting: Nurse Practitioner

## 2023-04-04 DIAGNOSIS — E119 Type 2 diabetes mellitus without complications: Secondary | ICD-10-CM | POA: Diagnosis not present

## 2023-04-04 DIAGNOSIS — E1169 Type 2 diabetes mellitus with other specified complication: Secondary | ICD-10-CM

## 2023-04-04 LAB — HM DIABETES EYE EXAM

## 2023-04-14 ENCOUNTER — Other Ambulatory Visit: Payer: Self-pay | Admitting: Nurse Practitioner

## 2023-04-14 DIAGNOSIS — E1169 Type 2 diabetes mellitus with other specified complication: Secondary | ICD-10-CM

## 2023-04-25 ENCOUNTER — Ambulatory Visit (INDEPENDENT_AMBULATORY_CARE_PROVIDER_SITE_OTHER): Payer: BC Managed Care – PPO | Admitting: Nurse Practitioner

## 2023-04-25 ENCOUNTER — Encounter: Payer: Self-pay | Admitting: Nurse Practitioner

## 2023-04-25 VITALS — BP 138/80 | HR 99 | Temp 98.0°F | Resp 18 | Ht 65.0 in | Wt 210.0 lb

## 2023-04-25 DIAGNOSIS — E1169 Type 2 diabetes mellitus with other specified complication: Secondary | ICD-10-CM | POA: Diagnosis not present

## 2023-04-25 DIAGNOSIS — Z7984 Long term (current) use of oral hypoglycemic drugs: Secondary | ICD-10-CM

## 2023-04-25 DIAGNOSIS — E785 Hyperlipidemia, unspecified: Secondary | ICD-10-CM | POA: Diagnosis not present

## 2023-04-25 DIAGNOSIS — Z794 Long term (current) use of insulin: Secondary | ICD-10-CM

## 2023-04-25 DIAGNOSIS — I10 Essential (primary) hypertension: Secondary | ICD-10-CM | POA: Diagnosis not present

## 2023-04-25 DIAGNOSIS — E1165 Type 2 diabetes mellitus with hyperglycemia: Secondary | ICD-10-CM

## 2023-04-25 LAB — COMPREHENSIVE METABOLIC PANEL
ALT: 13 U/L (ref 0–35)
AST: 16 U/L (ref 0–37)
Albumin: 4.2 g/dL (ref 3.5–5.2)
Alkaline Phosphatase: 53 U/L (ref 39–117)
BUN: 14 mg/dL (ref 6–23)
CO2: 25 meq/L (ref 19–32)
Calcium: 9.8 mg/dL (ref 8.4–10.5)
Chloride: 98 meq/L (ref 96–112)
Creatinine, Ser: 1.04 mg/dL (ref 0.40–1.20)
GFR: 69 mL/min (ref >60.00)
Glucose, Bld: 287 mg/dL — ABNORMAL HIGH (ref 70–99)
Potassium: 3.9 meq/L (ref 3.5–5.1)
Sodium: 135 meq/L (ref 135–145)
Total Bilirubin: 0.3 mg/dL (ref 0.2–1.2)
Total Protein: 7.8 g/dL (ref 6.0–8.3)

## 2023-04-25 LAB — LIPID PANEL
Cholesterol: 185 mg/dL (ref 0–200)
HDL: 51.6 mg/dL (ref >39.00)
LDL Cholesterol: 67 mg/dL (ref 0–99)
NonHDL: 133.58
Total CHOL/HDL Ratio: 4
Triglycerides: 333 mg/dL — ABNORMAL HIGH (ref 0.0–149.0)
VLDL: 66.6 mg/dL — ABNORMAL HIGH (ref 0.0–40.0)

## 2023-04-25 LAB — MICROALBUMIN / CREATININE URINE RATIO
Creatinine,U: 140.7 mg/dL
Microalb Creat Ratio: 2.4 mg/g (ref 0.0–30.0)
Microalb, Ur: 3.4 mg/dL — ABNORMAL HIGH (ref 0.0–1.9)

## 2023-04-25 LAB — HEMOGLOBIN A1C: Hgb A1c MFr Bld: 9 % — ABNORMAL HIGH (ref 4.6–6.5)

## 2023-04-25 MED ORDER — LISINOPRIL 5 MG PO TABS: 5.0000 mg | ORAL_TABLET | Freq: Every day | ORAL | 3 refills | Status: DC

## 2023-04-25 NOTE — Assessment & Plan Note (Signed)
Repeat lipid panel ?

## 2023-04-25 NOTE — Assessment & Plan Note (Signed)
>>  ASSESSMENT AND PLAN FOR TYPE 2 DIABETES MELLITUS WITH HYPERGLYCEMIA, WITHOUT LONG-TERM CURRENT USE OF INSULIN (HCC) WRITTEN ON 04/26/2023  4:23 PM BY Yessika Otte LUM, NP  No adverse effects with metformin  and glipizide  Negative DIABETES retinopathy.  Repeat hgbA1c. UACr and CMP today:A1c at 9%, normal CMP and UACr Add levemir  10units daily F/up in 3months

## 2023-04-25 NOTE — Patient Instructions (Signed)
Increase lisinopril dose to 5mg  daily Maintain other med doses Go to lab

## 2023-04-25 NOTE — Progress Notes (Signed)
Established Patient Visit  Patient: Laura Miller   DOB: 1985/07/24   37 y.o. Female  MRN: [id number] Visit Date: 04/26/2023  Subjective:    Chief Complaint  Patient presents with   Office visit     PT here for 3 month follow up HTN, DM, and hyperlipidemia   HPI HTN (hypertension), benign Elevated diastolic Compliant with amlodipine and lisinopril dose BP Readings from Last 3 Encounters:  04/25/23 138/80  01/17/23 130/89  10/11/22 120/70    Increase lisinopril to 5mg  every day Maintain amlodipine dose 5mg  every day Repeat CMP F/up in 3months  Type 2 diabetes mellitus with hyperglycemia, without long-term current use of insulin (HCC) No adverse effects with metformin and glipizide Negative DIABETES retinopathy.  Repeat hgbA1c. UACr and CMP today:A1c at 9%, normal CMP and UACr Add levemir 10units daily F/up in 3months  Hyperlipidemia associated with type 2 diabetes mellitus (HCC) Repeat lipid panel: persistent elevated triglycierides Maintain fenofibrate dose Add zetia 10units F/up in 3months  Wt Readings from Last 3 Encounters:  04/25/23 210 lb (95.3 kg)  01/17/23 213 lb (96.6 kg)  10/11/22 209 lb 12.8 oz (95.2 kg)    BP Readings from Last 3 Encounters:  04/25/23 138/80  01/17/23 130/89  10/11/22 120/70    Reviewed medical, surgical, and social history today  Medications: Outpatient Medications Prior to Visit  Medication Sig   amLODipine (NORVASC) 5 MG tablet TAKE 1 TABLET(5 MG) BY MOUTH DAILY   fenofibrate 160 MG tablet Take 1 tablet (160 mg total) by mouth daily.   fluticasone (FLONASE) 50 MCG/ACT nasal spray Place 1 spray into both nostrils daily.   glipiZIDE (GLUCOTROL XL) 5 MG 24 hr tablet TAKE 1 TABLET(5 MG) BY MOUTH DAILY WITH BREAKFAST   glucose blood (ONETOUCH VERIO) test strip USE TO CHECK BLOOD SUGAR ONCE A DAY   metFORMIN (GLUCOPHAGE-XR) 750 MG 24 hr tablet Take 1 tablet (750 mg total) by mouth 2 (two) times daily after a meal.    norgestimate-ethinyl estradiol (ORTHO-CYCLEN) 0.25-35 MG-MCG tablet Take 1 tablet by mouth daily.   OneTouch Delica Lancets 33G MISC USE TO CHECK BLOOD SUGAR DAILY   PREVIDENT 5000 SENSITIVE 1.1-5 % GEL See admin instructions.   Probiotic Product (PROBIOTIC DAILY) CAPS Take 1 capsule by mouth daily.   [DISCONTINUED] lisinopril (ZESTRIL) 2.5 MG tablet TAKE 1 TABLET(2.5 MG) BY MOUTH DAILY   No facility-administered medications prior to visit.   Reviewed past medical and social history.   ROS per HPI above      Objective:  BP 138/80 (BP Location: Right Arm, Patient Position: Sitting, Cuff Size: Large) Comment: recheck BP reading  Pulse 99 Comment: tachy  Temp 98 F (36.7 C) (Temporal)   Resp 18   Ht 5\' 5"  (1.651 m)   Wt 210 lb (95.3 kg)   LMP 03/30/2023 (Exact Date)   SpO2 100%   BMI 34.95 kg/m      Physical Exam Vitals and nursing note reviewed.  Constitutional:      Appearance: She is obese.  Cardiovascular:     Rate and Rhythm: Normal rate and regular rhythm.     Pulses: Normal pulses.     Heart sounds: Normal heart sounds.  Pulmonary:     Effort: Pulmonary effort is normal.     Breath sounds: Normal breath sounds.  Musculoskeletal:     Right lower leg: No edema.     Left lower leg: No  edema.  Neurological:     Mental Status: She is alert and oriented to person, place, and time.     Results for orders placed or performed in visit on 04/25/23  Lipid panel  Result Value Ref Range   Cholesterol 185 0 - 200 mg/dL   Triglycerides 010.9 (H) 0.0 - 149.0 mg/dL   HDL 32.35 >57.32 mg/dL   VLDL 20.2 (H) 0.0 - 54.2 mg/dL   LDL Cholesterol 67 0 - 99 mg/dL   Total CHOL/HDL Ratio 4    NonHDL 133.58   Hemoglobin A1c  Result Value Ref Range   Hgb A1c MFr Bld 9.0 (H) 4.6 - 6.5 %  Comprehensive metabolic panel  Result Value Ref Range   Sodium 135 135 - 145 mEq/L   Potassium 3.9 3.5 - 5.1 mEq/L   Chloride 98 96 - 112 mEq/L   CO2 25 19 - 32 mEq/L   Glucose, Bld 287 (H)  70 - 99 mg/dL   BUN 14 6 - 23 mg/dL   Creatinine, Ser 7.06 0.40 - 1.20 mg/dL   Total Bilirubin 0.3 0.2 - 1.2 mg/dL   Alkaline Phosphatase 53 39 - 117 U/L   AST 16 0 - 37 U/L   ALT 13 0 - 35 U/L   Total Protein 7.8 6.0 - 8.3 g/dL   Albumin 4.2 3.5 - 5.2 g/dL   GFR 23.76 >28.31 mL/min   Calcium 9.8 8.4 - 10.5 mg/dL  Microalbumin / creatinine urine ratio  Result Value Ref Range   Microalb, Ur 3.4 (H) 0.0 - 1.9 mg/dL   Creatinine,U 517.6 mg/dL   Microalb Creat Ratio 2.4 0.0 - 30.0 mg/g      Assessment & Plan:    Problem List Items Addressed This Visit     HTN (hypertension), benign    Elevated diastolic Compliant with amlodipine and lisinopril dose BP Readings from Last 3 Encounters:  04/25/23 138/80  01/17/23 130/89  10/11/22 120/70    Increase lisinopril to 5mg  every day Maintain amlodipine dose 5mg  every day Repeat CMP F/up in 3months      Relevant Medications   lisinopril (ZESTRIL) 5 MG tablet   ezetimibe (ZETIA) 10 MG tablet   Other Relevant Orders   Comprehensive metabolic panel (Completed)   Hyperlipidemia associated with type 2 diabetes mellitus (HCC)    Repeat lipid panel: persistent elevated triglycierides Maintain fenofibrate dose Add zetia 10units F/up in 3months      Relevant Medications   lisinopril (ZESTRIL) 5 MG tablet   insulin detemir (LEVEMIR FLEXPEN) 100 UNIT/ML FlexPen   ezetimibe (ZETIA) 10 MG tablet   Other Relevant Orders   Lipid panel (Completed)   Comprehensive metabolic panel (Completed)   Type 2 diabetes mellitus with hyperglycemia, without long-term current use of insulin (HCC) - Primary    No adverse effects with metformin and glipizide Negative DIABETES retinopathy.  Repeat hgbA1c. UACr and CMP today:A1c at 9%, normal CMP and UACr Add levemir 10units daily F/up in 3months      Relevant Medications   lisinopril (ZESTRIL) 5 MG tablet   insulin detemir (LEVEMIR FLEXPEN) 100 UNIT/ML FlexPen   Insulin Pen Needle (PEN NEEDLES)  31G X 6 MM MISC   Other Relevant Orders   Hemoglobin A1c (Completed)   Comprehensive metabolic panel (Completed)   Microalbumin / creatinine urine ratio (Completed)   Return in about 3 months (around 07/26/2023) for CPE (fasting).     [name], NP

## 2023-04-25 NOTE — Assessment & Plan Note (Signed)
No adverse effects with metformin and glipizide Negative DIABETES retinopathy.  Repeat hgbA1c. UACr and CMP today:A1c at 9%, normal CMP and UACr Add levemir 10units daily F/up in 3months

## 2023-04-25 NOTE — Assessment & Plan Note (Signed)
Elevated diastolic Compliant with amlodipine and lisinopril dose BP Readings from Last 3 Encounters:  04/25/23 138/80  01/17/23 130/89  10/11/22 120/70    Increase lisinopril to 5mg  every day Maintain amlodipine dose 5mg  every day Repeat CMP F/up in 3months

## 2023-04-26 MED ORDER — EZETIMIBE 10 MG PO TABS: 10.0000 mg | ORAL_TABLET | Freq: Every day | ORAL | 1 refills | Status: DC

## 2023-04-26 MED ORDER — LEVEMIR FLEXPEN 100 UNIT/ML ~~LOC~~ SOPN: 10.0000 [IU] | PEN_INJECTOR | Freq: Every day | SUBCUTANEOUS | 0 refills | Status: DC

## 2023-04-26 MED ORDER — PEN NEEDLES 31G X 6 MM MISC: 1.0000 | Freq: Every day | 2 refills | Status: DC

## 2023-04-26 NOTE — Addendum Note (Signed)
Addended by: Michaela Corner on: 04/26/2023 04:24 PM   Modules accepted: Orders

## 2023-06-20 ENCOUNTER — Ambulatory Visit (INDEPENDENT_AMBULATORY_CARE_PROVIDER_SITE_OTHER): Payer: BC Managed Care – PPO | Admitting: Obstetrics & Gynecology

## 2023-06-20 ENCOUNTER — Encounter: Payer: Self-pay | Admitting: Obstetrics & Gynecology

## 2023-06-20 ENCOUNTER — Other Ambulatory Visit (HOSPITAL_COMMUNITY)
Admission: RE | Admit: 2023-06-20 | Discharge: 2023-06-20 | Disposition: A | Discharge status: Home / Self Care | Payer: BC Managed Care – PPO | Source: Ambulatory Visit | Attending: Obstetrics & Gynecology | Admitting: Obstetrics & Gynecology

## 2023-06-20 VITALS — BP 137/84 | HR 105 | Ht 65.0 in | Wt 208.0 lb

## 2023-06-20 DIAGNOSIS — Z113 Encounter for screening for infections with a predominantly sexual mode of transmission: Secondary | ICD-10-CM | POA: Insufficient documentation

## 2023-06-20 DIAGNOSIS — Z01419 Encounter for gynecological examination (general) (routine) without abnormal findings: Secondary | ICD-10-CM | POA: Diagnosis not present

## 2023-06-20 DIAGNOSIS — Z1331 Encounter for screening for depression: Secondary | ICD-10-CM

## 2023-06-20 NOTE — Progress Notes (Signed)
Subjective:     Laura Miller is a 37 y.o. female here for a routine exam.  Current complaints: none. Still on OCPs. Not currently sexually active.  Recently got braces. Needs more scheduled personal time for exercise.    Gynecologic History Patient's last menstrual period was 05/26/2023 (exact date). Contraception: OCP (estrogen/progesterone) Last Pap: 01/26/21. Results were: normal Last mammogram: n/a.   Obstetric History OB History  Gravida Para Term Preterm AB Living  0 0 0 0 0 0  SAB IAB Ectopic Multiple Live Births  0 0 0 0 0     The following portions of the patient's history were reviewed and updated as appropriate: allergies, current medications, past family history, past medical history, past social history, past surgical history, and problem list.  Review of Systems Pertinent items are noted in HPI.    Objective:  BP 137/84 (BP Location: Left Arm, Patient Position: Sitting, Cuff Size: Large)   Pulse (!) 105   Ht 5\' 5"  (1.651 m)   Wt 208 lb (94.3 kg)   LMP 05/26/2023 (Exact Date)   BMI 34.61 kg/m  General Appearance:    Alert, cooperative, no distress, appears stated age  Head:    Normocephalic, without obvious abnormality, atraumatic  Eyes:    conjunctiva/corneas clear, EOM's intact, both eyes  Ears:    Normal external ear canals, both ears  Nose:   Nares normal, septum midline, mucosa normal, no drainage    or sinus tenderness  Throat:   Lips, mucosa, and tongue normal; teeth and gums normal  Neck:   Supple, symmetrical, trachea midline, no adenopathy;    thyroid:  no enlargement/tenderness/nodules  Back:     Symmetric, no curvature, ROM normal, no CVA tenderness  Lungs:     respirations unlabored  Chest Wall:    No tenderness or deformity   Heart:    Regular rate and rhythm  Breast Exam:    No tenderness, masses, or nipple abnormality  Abdomen:     Soft, non-tender, bowel sounds active all four quadrants,    no masses, no organomegaly  Genitalia:    Normal  female without lesion, discharge or tenderness   Uterus sl enlarged- suspect uterine fibroids (asymptomatic)   Extremities:   Extremities normal, atraumatic, no cyanosis or edema  Pulses:   2+ and symmetric all extremities  Skin:   Skin color, texture, turgor normal, no rashes or lesions     Assessment:    Healthy female exam.  Enlarged uterus- no sx   Plan:   Laura Miller was seen today for annual exam.  Diagnoses and all orders for this visit:  Well female exam with routine gynecological exam   STI screen  F/u 1 year or sooner prn   Valentina Alcoser L. Harraway-Smith, M.D., [place]n Core

## 2023-06-20 NOTE — Addendum Note (Signed)
Addended by: Mikey Bussing on: 06/20/2023 01:43 PM   Modules accepted: Orders

## 2023-06-20 NOTE — Patient Instructions (Signed)
Exercising to Stay Healthy To become healthy and stay healthy, it is recommended that you do moderate-intensity and vigorous-intensity exercise. You can tell that you are exercising at a moderate intensity if your heart starts beating faster and you start breathing faster but can still hold a conversation. You can tell that you are exercising at a vigorous intensity if you are breathing much harder and faster and cannot hold a conversation while exercising. How can exercise benefit me? Exercising regularly is important. It has many health benefits, such as: Improving overall fitness, flexibility, and endurance. Increasing bone density. Helping with weight control. Decreasing body fat. Increasing muscle strength and endurance. Reducing stress and tension, anxiety, depression, or anger. Improving overall health. What guidelines should I follow while exercising? Before you start a new exercise program, talk with your health care provider. Do not exercise so much that you hurt yourself, feel dizzy, or get very short of breath. Wear comfortable clothes and wear shoes with good support. Drink plenty of water while you exercise to prevent dehydration or heat stroke. Work out until your breathing and your heartbeat get faster (moderate intensity). How often should I exercise? Choose an activity that you enjoy, and set realistic goals. Your health care provider can help you make an activity plan that is individually designed and works best for you. Exercise regularly as told by your health care provider. This may include: Doing strength training two times a week, such as: Lifting weights. Using resistance bands. Push-ups. Sit-ups. Yoga. Doing a certain intensity of exercise for a given amount of time. Choose from these options: A total of 150 minutes of moderate-intensity exercise every week. A total of 75 minutes of vigorous-intensity exercise every week. A mix of moderate-intensity and  vigorous-intensity exercise every week. Children, pregnant women, people who have not exercised regularly, people who are overweight, and older adults may need to talk with a health care provider about what activities are safe to perform. If you have a medical condition, be sure to talk with your health care provider before you start a new exercise program. What are some exercise ideas? Moderate-intensity exercise ideas include: Walking 1 mile (1.6 km) in about 15 minutes. Biking. Hiking. Golfing. Dancing. Water aerobics. Vigorous-intensity exercise ideas include: Walking 4.5 miles (7.2 km) or more in about 1 hour. Jogging or running 5 miles (8 km) in about 1 hour. Biking 10 miles (16.1 km) or more in about 1 hour. Lap swimming. Roller-skating or in-line skating. Cross-country skiing. Vigorous competitive sports, such as football, basketball, and soccer. Jumping rope. Aerobic dancing. What are some everyday activities that can help me get exercise? Yard work, such as: Pushing a lawn mower. Raking and bagging leaves. Washing your car. Pushing a stroller. Shoveling snow. Gardening. Washing windows or floors. How can I be more active in my day-to-day activities? Use stairs instead of an elevator. Take a walk during your lunch break. If you drive, park your car farther away from your work or school. If you take public transportation, get off one stop early and walk the rest of the way. Stand up or walk around during all of your indoor phone calls. Get up, stretch, and walk around every 30 minutes throughout the day. Enjoy exercise with a friend. Support to continue exercising will help you keep a regular routine of activity. Where to find more information You can find more information about exercising to stay healthy from: U.[place]. Department of Health and Human Services: www.hhs.gov Centers for Disease Control and Prevention (  CDC): www.cdc.gov Summary Exercising regularly is  important. It will improve your overall fitness, flexibility, and endurance. Regular exercise will also improve your overall health. It can help you control your weight, reduce stress, and improve your bone density. Do not exercise so much that you hurt yourself, feel dizzy, or get very short of breath. Before you start a new exercise program, talk with your health care provider. This information is not intended to replace advice given to you by your health care provider. Make sure you discuss any questions you have with your health care provider. Document Revised: 10/15/2020 Document Reviewed: 10/15/2020 Elsevier Patient Education  2024 [employer] Inc[employer]  

## 2023-06-21 LAB — CERVICOVAGINAL ANCILLARY ONLY
Chlamydia: NEGATIVE
Comment: NEGATIVE
Comment: NEGATIVE
Comment: NORMAL
Neisseria Gonorrhea: NEGATIVE
Trichomonas: NEGATIVE

## 2023-07-24 ENCOUNTER — Telehealth: Payer: Self-pay

## 2023-07-24 NOTE — Progress Notes (Signed)
Care Guide Pharmacy Note  07/24/2023 Name: [name] MRN: [id number] DOB: 10-Sep-1985  Referred By: Anne Ng, NP Reason for referral: Care Coordination ([facility] Diabetes. )   Laura Miller is a 38 y.o. year old female who is a primary care patient of Nche, Bonna Gains, NP.  Mikenzy Yao was referred to the pharmacist for assistance related to: DMII  An unsuccessful telephone outreach was attempted today to contact the patient who was referred to the pharmacy team for assistance with diabetes. Additional attempts will be made to contact the patient.  [facility]  [facility], [facility] Care Management Assistant Direct Dial: [phone]  Fax: [phone]

## 2023-07-25 ENCOUNTER — Encounter: Payer: BC Managed Care – PPO | Admitting: Nurse Practitioner

## 2023-07-27 ENCOUNTER — Telehealth: Payer: Self-pay

## 2023-07-27 NOTE — Progress Notes (Signed)
Care Guide Pharmacy Note  07/27/2023 Name: Laura Miller MRN: [id number] DOB: [birth year]  Referred By: Anne Ng, NP Reason for referral: Care Coordination ([facility] Diabetes. )   Laura Miller is a 38 y.o. year old female who is a primary care patient of Nche, Bonna Gains, NP.  Laura Miller was referred to the pharmacist for assistance related to: DMII  Successful contact was made with the patient to discuss pharmacy services including being ready for the pharmacist to call at least 5 minutes before the scheduled appointment time and to have medication bottles and any blood pressure readings ready for review. The patient agreed to meet with the pharmacist via telephone visit on (date/time). 08/02/23 at 11:00 a.m.   [facility]  [facility], [facility] Care Management Assistant Direct Dial: [phone]  Fax: [phone]

## 2023-08-02 ENCOUNTER — Other Ambulatory Visit: Payer: Self-pay

## 2023-08-02 ENCOUNTER — Other Ambulatory Visit: Payer: Self-pay | Admitting: Nurse Practitioner

## 2023-08-02 DIAGNOSIS — E1165 Type 2 diabetes mellitus with hyperglycemia: Secondary | ICD-10-CM

## 2023-08-02 DIAGNOSIS — E1169 Type 2 diabetes mellitus with other specified complication: Secondary | ICD-10-CM

## 2023-08-02 MED ORDER — TIRZEPATIDE 2.5 MG/0.5ML ~~LOC~~ SOAJ: 2.5000 mg | SUBCUTANEOUS | 2 refills | Status: DC

## 2023-08-02 MED ORDER — ONETOUCH DELICA LANCETS 33G MISC: 3 refills | Status: AC

## 2023-08-02 NOTE — Assessment & Plan Note (Signed)
>>  ASSESSMENT AND PLAN FOR TYPE 2 DIABETES MELLITUS WITH HYPERGLYCEMIA, WITHOUT LONG-TERM CURRENT USE OF INSULIN (HCC) WRITTEN ON 08/02/2023  3:06 PM BY Beatrix Breece LUM, NP  D/c levemir  Start mounjaro  2.5mg  F/up in 3months

## 2023-08-02 NOTE — Progress Notes (Signed)
08/02/2023 Name: Laura Miller MRN: [id number] DOB: February 27, 1986  Chief Complaint  Patient presents with   Diabetes   Laura Miller is a 38 y.o. year old female who presented for a telephone visit.   They were referred to the pharmacist by a quality report for assistance in managing diabetes.   Subjective:  Care Team: Primary Care Provider: Anne Ng, NP   Medication Access/Adherence  Current Pharmacy:  [facility] [phone] - [place], [place] - [address] [place] [address] Phone: [phone] Fax: [phone]  -Patient reports affordability concerns with their medications: No  -Patient reports access/transportation concerns to their pharmacy: No  -Patient reports adherence concerns with their medications:  Yes    Diabetes: Current medications: Glipizide XL 5 mg daily with breakfast, metformin XR 750 mg twice daily -Patient is prescribed Levemir to use 10 units daily; this prescription was picked up, but the patient never started using the medication due to misplacing it. -A1c of 9.0% on 04/25/2023 -Patient states she does check home blood glucose on occasion but does not provide any recent values -Endorses using Ozempic and Mounjaro in the past, but cost was a limiting factor with these medications.  Of note, she tolerated these well with no adverse side effects. -Patient states she is needing a refill of lancets, but the pharmacy has been unable to fill these. -Patient does have a new insurance plan starting the beginning of 2025  Objective: Lab Results  Component Value Date   HGBA1C 9.0 (H) 04/25/2023   Lab Results  Component Value Date   CREATININE 1.04 04/25/2023   BUN 14 04/25/2023   NA 135 04/25/2023   K 3.9 04/25/2023   CL 98 04/25/2023   CO2 25 04/25/2023   Lab Results  Component Value Date   CHOL 185 04/25/2023   HDL 51.60 04/25/2023   LDLCALC 67 04/25/2023   LDLDIRECT 79.0 01/17/2023   TRIG  333.0 (H) 04/25/2023   CHOLHDL 4 04/25/2023   Medications Reviewed Today     Reviewed by Lenna Gilford, RPH (Pharmacist) on 08/02/23 at 1241  Med List Status: <None>   Medication Order Taking? Sig Documenting Provider Last Dose Status Informant  amLODipine (NORVASC) 5 MG tablet [id number] Yes TAKE 1 TABLET(5 MG) BY MOUTH DAILY Nche, Bonna Gains, NP Taking Active   ezetimibe (ZETIA) 10 MG tablet [id number] Yes Take 1 tablet (10 mg total) by mouth daily. Anne Ng, NP Taking Active   fenofibrate 160 MG tablet [id number] Yes Take 1 tablet (160 mg total) by mouth daily. Nche, Bonna Gains, NP Taking Active   fluticasone (FLONASE) 50 MCG/ACT nasal spray [id number] Yes Place 1 spray into both nostrils daily. [provider] Taking Active   glipiZIDE (GLUCOTROL XL) 5 MG 24 hr tablet [id number] Yes TAKE 1 TABLET(5 MG) BY MOUTH DAILY WITH BREAKFAST Nche, Bonna Gains, NP Taking Active   glucose blood (ONETOUCH VERIO) test strip [id number] Yes USE TO CHECK BLOOD SUGAR ONCE A DAY Nche, Bonna Gains, NP Taking Active   insulin detemir (LEVEMIR FLEXPEN) 100 UNIT/ML FlexPen [id number] No Inject 10 Units into the skin daily.  Patient not taking: Reported on 08/02/2023   Nche, Bonna Gains, NP Not Taking Active   Insulin Pen Needle (PEN NEEDLES) 31G X 6 MM MISC [id number] No 1 Application by Does not apply route daily at 12 noon.  Patient not taking: Reported on 08/02/2023   Nche, Bonna Gains, NP Not Taking Active   lisinopril (ZESTRIL)  5 MG tablet [id number] Yes Take 1 tablet (5 mg total) by mouth daily. Nche, Bonna Gains, NP Taking Active   metFORMIN (GLUCOPHAGE-XR) 750 MG 24 hr tablet [id number] Yes Take 1 tablet (750 mg total) by mouth 2 (two) times daily after a meal. Nche, Bonna Gains, NP Taking Active   norgestimate-ethinyl estradiol (ORTHO-CYCLEN) 0.25-35 MG-MCG tablet [id number] Yes Take 1 tablet by mouth daily. Willodean Rosenthal, MD Taking Active   OneTouch Delica Lancets  33G [place] [id number] Yes USE TO CHECK BLOOD SUGAR DAILY Nche, Bonna Gains, NP Taking Active   PREVIDENT 5000 SENSITIVE 1.1-5 % GEL [id number] Yes See admin instructions. [provider] Taking Active   Probiotic Product (PROBIOTIC DAILY) CAPS [id number] Yes Take 1 capsule by mouth daily. [provider] Taking Active            Assessment/Plan:   Diabetes: -Currently uncontrolled -Recommend addition of GLP-1 based on insurance coverage and preference versus initiating insulin therapy at this time.  If PCP agrees, I will contact insurance to check coverage. -Pending prescription refill for One Touch Delica lancets, because last prescription sent to pharmacy would have expired at this point.  Likely the reason pharmacy could not refill lancets. -Recommend patient begin monitoring fasting blood glucose and 2-hour postprandial blood glucose daily and recording values -Patient plans to schedule a follow-up visit with PCP, and she will be due for a follow-up A1c  Follow Up Plan: 2/4  Lenna Gilford, PharmD, DPLA

## 2023-08-02 NOTE — Assessment & Plan Note (Signed)
D/c levemir Start mounjaro 2.5mg  F/up in 3months

## 2023-08-06 NOTE — Progress Notes (Signed)
   08/06/2023  Patient ID: Laura Miller, female   DOB: [birth year], 38 y.o.   MRN: [id number]  Contacted patient's pharmacy to check co-pay for [facility].  This is going through on the patient's insurance and and he voucher coupon, but the co-pay is still greater than $600.  Pharmacy has ordered her lancets, and they should be in later this afternoon.  Patient has 2 active insurance plans; so I contacted both to see if there were cheaper alternatives to the [facility].  Victoza now has a generic available, and insurance states this would be a $15 co-pay; but this is a once daily versus once a week injection.  The only other option that may be affordable is Trulicity, because the manufacture co-pay card will usually pay more than the other GLP-1 manufacture co-pay cards.  This does have a $1900 per year max benefit, though.  Follow-up is scheduled with the patient tomorrow, and we can discuss options then.  Lenna Gilford, PharmD, DPLA

## 2023-08-07 ENCOUNTER — Other Ambulatory Visit: Payer: Self-pay

## 2023-08-09 ENCOUNTER — Other Ambulatory Visit: Payer: Self-pay

## 2023-08-09 DIAGNOSIS — E1165 Type 2 diabetes mellitus with hyperglycemia: Secondary | ICD-10-CM

## 2023-08-09 NOTE — Progress Notes (Signed)
   08/09/2023  Patient ID: Laura Miller, female   DOB: 1985/11/06, 38 y.o.   MRN: [id number]  Patient out reach to further discuss diabetes treatment options and affordability with patient's current plan.  Patient states she no longer has the Blue Cross Blue Shield plan through the exchange; she just have coverage through her position at [facility] [employer] home [facility].  When I contacted this plan, they do not cover brand name medications; so the only covered GLP-1 option would be generic Victoza .  Discussed that this is a once daily versus once weekly medication.  Informed patient that data for this medication does not reflect relevant reduction and bioavailability of oral contraceptives, but I would recommend backup contraception if pregnancy is a concern for her.  Patient endorses understanding and would prefer to try this medication versus long-acting insulin.  I recommend starting at 0.6 mg daily for 1 week, increasing to 1.2 mg daily for 1 week, and then increasing to 1.8 mg daily thereafter.  Patient has a follow-up scheduled with PCP on 4/3; but if PCP is agreeable with plan, I will schedule a follow-up to check in with the patient in 4 weeks.  Will notify patient and schedule follow-up via MyChart based on PCP response.  [name], PharmD, DPLA

## 2023-08-14 ENCOUNTER — Other Ambulatory Visit: Payer: Self-pay | Admitting: Nurse Practitioner

## 2023-08-14 DIAGNOSIS — I10 Essential (primary) hypertension: Secondary | ICD-10-CM

## 2023-08-14 MED ORDER — LIRAGLUTIDE 18 MG/3ML ~~LOC~~ SOPN: PEN_INJECTOR | SUBCUTANEOUS | 1 refills | Status: DC

## 2023-08-14 NOTE — Assessment & Plan Note (Signed)
>>  ASSESSMENT AND PLAN FOR TYPE 2 DIABETES MELLITUS WITH HYPERGLYCEMIA, WITHOUT LONG-TERM CURRENT USE OF INSULIN (HCC) WRITTEN ON 08/14/2023  3:04 PM BY Verner Kopischke LUM, NP  Switch mounjaro  to victoza  due to cost

## 2023-08-14 NOTE — Assessment & Plan Note (Signed)
Switch mounjaro to victoza due to cost

## 2023-08-17 ENCOUNTER — Other Ambulatory Visit: Payer: Self-pay | Admitting: Nurse Practitioner

## 2023-08-17 DIAGNOSIS — E1169 Type 2 diabetes mellitus with other specified complication: Secondary | ICD-10-CM

## 2023-08-17 MED ORDER — GLIPIZIDE ER 5 MG PO TB24: 5.0000 mg | ORAL_TABLET | Freq: Every day | ORAL | 0 refills | Status: DC

## 2023-08-31 ENCOUNTER — Other Ambulatory Visit: Payer: Self-pay | Admitting: Nurse Practitioner

## 2023-08-31 DIAGNOSIS — E1165 Type 2 diabetes mellitus with hyperglycemia: Secondary | ICD-10-CM

## 2023-09-03 ENCOUNTER — Telehealth: Payer: Self-pay

## 2023-09-03 NOTE — Telephone Encounter (Signed)
 Patient was identified as falling into the [place] Measure - Diabetes.   Patient was: Appointment scheduled for lab or office visit for A1c.

## 2023-09-14 ENCOUNTER — Other Ambulatory Visit: Payer: Self-pay

## 2023-09-14 NOTE — Progress Notes (Signed)
   09/14/2023  Patient ID: Laura Miller, female   DOB: 1985-11-23, 38 y.o.   MRN: [id number]  Subjective/objective Telephone visit to follow-up on management of type 2 diabetes  Diabetes management plan -Current medications: Glipizide XL 5 mg daily with breakfast, Victoza 1.8 mg daily, metformin XR 750 mg twice daily after meals -Patient started Victoza titration approximately 1 month ago and has been at 1.8 mg daily dosing 2 weeks now.  Endorses tolerating medication well at this time-states initially had stomach upset when increasing doses. -Patient states fasting blood glucose prior to initiation of Victoza was >200, and readings are now ranging 120-150 -Does not endorse any signs or symptoms of hypoglycemia or hyperglycemia -Last A1c was 9.0 on 04/25/2023  Assessment/plan  Diabetes management plan -Continue current regimen and regular monitoring and recording of blood glucose -Continue lifestyle modifications including dietary changes and increased exercise -Patient sees PCP again 4/3 and is due for A1c-reading will only reflect about 6 weeks of Victoza use  Follow-up: 3 months  Lenna Gilford, PharmD, DPLA

## 2023-09-23 ENCOUNTER — Other Ambulatory Visit: Payer: Self-pay | Admitting: Nurse Practitioner

## 2023-09-23 DIAGNOSIS — E1165 Type 2 diabetes mellitus with hyperglycemia: Secondary | ICD-10-CM

## 2023-09-24 NOTE — Telephone Encounter (Signed)
 Medication: Liraglutide (Victoza) 18 mg/3 mL Directions: Inject 0.6 mg daily for 1 week, 1.2 mg daily for 1 week and 1.8 mg daily Last given: 08/14/23 Number refills: 1 Last o/v: 04/25/23 Follow up: 3 months-10/04/23 (CPE) Labs: 04/25/23  Should patient be injection 1.8 mg daily now?

## 2023-09-25 ENCOUNTER — Telehealth: Payer: Self-pay | Admitting: Nurse Practitioner

## 2023-09-25 NOTE — Telephone Encounter (Signed)
 Changed appointment from Physical to office visit and changed appointment note to reflect follow up

## 2023-09-25 NOTE — Telephone Encounter (Signed)
 See note

## 2023-10-04 ENCOUNTER — Ambulatory Visit (INDEPENDENT_AMBULATORY_CARE_PROVIDER_SITE_OTHER): Payer: PRIVATE HEALTH INSURANCE | Admitting: Nurse Practitioner

## 2023-10-04 ENCOUNTER — Encounter: Payer: Self-pay | Admitting: Nurse Practitioner

## 2023-10-04 VITALS — BP 150/80 | HR 112 | Temp 97.5°F | Ht 65.0 in | Wt 207.4 lb

## 2023-10-04 DIAGNOSIS — I10 Essential (primary) hypertension: Secondary | ICD-10-CM

## 2023-10-04 DIAGNOSIS — Z0001 Encounter for general adult medical examination with abnormal findings: Secondary | ICD-10-CM

## 2023-10-04 DIAGNOSIS — R000 Tachycardia, unspecified: Secondary | ICD-10-CM | POA: Insufficient documentation

## 2023-10-04 DIAGNOSIS — Z7984 Long term (current) use of oral hypoglycemic drugs: Secondary | ICD-10-CM

## 2023-10-04 DIAGNOSIS — Z0000 Encounter for general adult medical examination without abnormal findings: Secondary | ICD-10-CM | POA: Diagnosis not present

## 2023-10-04 DIAGNOSIS — E1165 Type 2 diabetes mellitus with hyperglycemia: Secondary | ICD-10-CM

## 2023-10-04 DIAGNOSIS — E785 Hyperlipidemia, unspecified: Secondary | ICD-10-CM

## 2023-10-04 DIAGNOSIS — Z23 Encounter for immunization: Secondary | ICD-10-CM

## 2023-10-04 DIAGNOSIS — E1169 Type 2 diabetes mellitus with other specified complication: Secondary | ICD-10-CM | POA: Diagnosis not present

## 2023-10-04 LAB — COMPREHENSIVE METABOLIC PANEL WITH GFR
ALT: 14 U/L (ref 0–35)
AST: 14 U/L (ref 0–37)
Albumin: 4.4 g/dL (ref 3.5–5.2)
Alkaline Phosphatase: 42 U/L (ref 39–117)
BUN: 12 mg/dL (ref 6–23)
CO2: 23 meq/L (ref 19–32)
Calcium: 10.3 mg/dL (ref 8.4–10.5)
Chloride: 102 meq/L (ref 96–112)
Creatinine, Ser: 1.03 mg/dL (ref 0.40–1.20)
GFR: 69.59 mL/min (ref >60.00)
Glucose, Bld: 162 mg/dL — ABNORMAL HIGH (ref 70–99)
Potassium: 4.2 meq/L (ref 3.5–5.1)
Sodium: 138 meq/L (ref 135–145)
Total Bilirubin: 0.3 mg/dL (ref 0.2–1.2)
Total Protein: 7.8 g/dL (ref 6.0–8.3)

## 2023-10-04 LAB — LIPID PANEL
Cholesterol: 159 mg/dL (ref 0–200)
HDL: 52.8 mg/dL (ref >39.00)
LDL Cholesterol: 74 mg/dL (ref 0–99)
NonHDL: 106.52
Total CHOL/HDL Ratio: 3
Triglycerides: 164 mg/dL — ABNORMAL HIGH (ref 0.0–149.0)
VLDL: 32.8 mg/dL (ref 0.0–40.0)

## 2023-10-04 LAB — MICROALBUMIN / CREATININE URINE RATIO
Creatinine,U: 138.9 mg/dL
Microalb Creat Ratio: 12.8 mg/g (ref 0.0–30.0)
Microalb, Ur: 1.8 mg/dL (ref 0.0–1.9)

## 2023-10-04 LAB — CBC
HCT: 32.6 % — ABNORMAL LOW (ref 36.0–46.0)
Hemoglobin: 11.1 g/dL — ABNORMAL LOW (ref 12.0–15.0)
MCHC: 33.9 g/dL (ref 30.0–36.0)
MCV: 85.9 fl (ref 78.0–100.0)
Platelets: 477 10*3/uL — ABNORMAL HIGH (ref 150.0–400.0)
RBC: 3.8 Mil/uL — ABNORMAL LOW (ref 3.87–5.11)
RDW: 13.3 % (ref 11.5–15.5)
WBC: 7.5 10*3/uL (ref 4.0–10.5)

## 2023-10-04 LAB — TSH: TSH: 1.12 u[IU]/mL (ref 0.35–5.50)

## 2023-10-04 LAB — T4, FREE: Free T4: 0.65 ng/dL (ref 0.60–1.60)

## 2023-10-04 MED ORDER — LIRAGLUTIDE 18 MG/3ML ~~LOC~~ SOPN: 1.8000 mg | PEN_INJECTOR | Freq: Every day | SUBCUTANEOUS | 0 refills | Status: DC

## 2023-10-04 MED ORDER — LISINOPRIL 10 MG PO TABS: 10.0000 mg | ORAL_TABLET | Freq: Every day | ORAL | 1 refills | Status: DC

## 2023-10-04 NOTE — Assessment & Plan Note (Addendum)
 Chronic, asymptomatic, previous HR 99-110 Echo 2021: normal Check THYROID, CBC, CMP and ECG today Advised to start daily exercise and stop caffeine consumption

## 2023-10-04 NOTE — Progress Notes (Signed)
 Complete physical exam  Patient: Laura Miller   DOB: [birth year]   37 y.o. Female  MRN: [id number] Visit Date: 10/04/2023  Subjective:    Chief Complaint  Patient presents with   Annual Exam    Medication refills   Laura Miller is a 38 y.o. female who presents today for a complete physical exam. She reports consuming a general diet.  No exercise regimen  She generally feels fairly well. She reports sleeping fairly well. She does have additional problems to discuss today.  Vision:Yes Dental:Yes STD Screen:No  BP Readings from Last 3 Encounters:  10/04/23 (!) 150/80  06/20/23 137/84  04/25/23 138/80   Wt Readings from Last 3 Encounters:  10/04/23 207 lb 6.4 oz (94.1 kg)  06/20/23 208 lb (94.3 kg)  04/25/23 210 lb (95.3 kg)   Most recent fall risk assessment:    10/04/2023   10:01 AM  Fall Risk   Falls in the past year? 0  Number falls in past yr: 0  Injury with Fall? 0  Risk for fall due to : No Fall Risks  Follow up Falls evaluation completed     Depression screen:Yes - No Depression Most recent depression screenings:    10/04/2023   10:02 AM 06/20/2023    1:17 PM  PHQ 2/9 Scores  PHQ - 2 Score 0 0  PHQ- 9 Score 0 0    HPI  Tachycardia Chronic, asymptomatic, previous HR 99-110 Echo 2021: normal Check THYROID, CBC, CMP and ECG today Advised to start daily exercise and stop caffeine consumption  HTN (hypertension), benign BP not at goal Reports compliance with lisinopril 5mg  and amlodipine 5mg  BP Readings from Last 3 Encounters:  10/04/23 (!) 150/80  06/20/23 137/84  04/25/23 138/80    Increase lisinopril to 10mg  every day Maintain amlodipine dose Repeat CMP F/up in 1months  Type 2 diabetes mellitus with hyperglycemia, without long-term current use of insulin (HCC) Switch mounjaro to victoza due to cost Uncontrolled glucose with last hgbA1 at 9% Current use of victoza and metformin UACr: 16.33mg /g, 11.12mg /g, and 24.16mg /g Current use of and  ACE-I Last LDL at 6, but trig at 333. Current use of zetia and fenofibrate, hx of statin allergic reaction  Repeat hgbA1c, CMP, and lipid panel Maintain current med dose F/up in 3months  Hyperlipidemia associated with type 2 diabetes mellitus (HCC) Last LDL at 6, but trig at 333. Current use of zetia and fenofibrate,  hx of statin allergic reaction Advised to maintain a mediterranean diet Repeat lipid panel and CMP F/up in 3months   Past Medical History:  Diagnosis Date   Chicken pox    Diabetes mellitus type 2 in obese    History of jaundice    infant   HTN (hypertension), benign    Pure hypercholesterolemia 08/26/2020   Past Surgical History:  Procedure Laterality Date   NO PAST SURGERIES     Social History   Socioeconomic History   Marital status: Single    Spouse name: Not on file   Number of children: Not on file   Years of education: Not on file   Highest education level: Master's degree (e.g., MA, MS, MEng, MEd, MSW, MBA)  Occupational History   Occupation: Production designer, theatre/television/film  Tobacco Use   Smoking status: Never   Smokeless tobacco: Never  Vaping Use   Vaping status: Never Used  Substance and Sexual Activity   Alcohol use: No    Alcohol/week: 0.0 standard drinks of alcohol   Drug use: No  Sexual activity: Yes    Partners: Male    Birth control/protection: Pill  Other Topics Concern   Not on file  Social History Narrative   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)   Has 2 sisters   Single, lives with her younger sister   No pets   Enjoys movies, taking a Estate manager/land agent   Social Drivers of Corporate investment banker Strain: Low Risk  (09/30/2023)   Overall Financial Resource Strain (CARDIA)    Difficulty of Paying Living Expenses: Not hard at all  Food Insecurity: No Food Insecurity (09/30/2023)   Hunger Vital Sign    Worried About Running Out of Food in the Last Year: Never true    Ran Out of Food in the Last Year: Never true  Transportation Needs: No  Transportation Needs (09/30/2023)   PRAPARE - Administrator, Civil Service (Medical): No    Lack of Transportation (Non-Medical): No  Physical Activity: Inactive (09/30/2023)   Exercise Vital Sign    Days of Exercise per Week: 0 days    Minutes of Exercise per Session: 20 min  Stress: No Stress Concern Present (09/30/2023)   [employer] of Occupational Health - Occupational Stress Questionnaire    Feeling of Stress : Not at all  Social Connections: Moderately Integrated (09/30/2023)   Social Connection and Isolation Panel [NHANES]    Frequency of Communication with Friends and Family: More than three times a week    Frequency of Social Gatherings with Friends and Family: Three times a week    Attends Religious Services: More than 4 times per year    Active Member of Clubs or Organizations: Yes    Attends Engineer, structural: More than 4 times per year    Marital Status: Never married  Intimate Partner Violence: Not on file   Family Status  Relation Name Status   Father  Alive   MGM  Alive   MGF  Alive   PGM  Alive   PGF  Deceased   Mother  Alive   Sister Vonte Alive   Sister Gabriel Cirri Deceased  No partnership data on file   Family History  Problem Relation Age of Onset   Diabetes Father    Diabetes Maternal Grandmother    Hyperlipidemia Maternal Grandmother    Hyperlipidemia Maternal Grandfather    Diabetes Paternal Grandmother    Diabetes Paternal Grandfather    Diabetes Mother    Diabetes Sister    Allergies  Allergen Reactions   Pravastatin Itching and Rash    Patient Care Team: Ady Heimann, Bonna Gains, NP as PCP - General (Internal Medicine) Corky Crafts, MD as PCP - Cardiology (Cardiology) Perian Tedder, Bonna Gains, NP as Nurse Practitioner (Internal Medicine)   Medications: Outpatient Medications Prior to Visit  Medication Sig   amLODipine (NORVASC) 5 MG tablet TAKE 1 TABLET(5 MG) BY MOUTH DAILY   ezetimibe (ZETIA) 10 MG tablet TAKE 1  TABLET(10 MG) BY MOUTH DAILY   fenofibrate 160 MG tablet Take 1 tablet (160 mg total) by mouth daily.   fluticasone (FLONASE) 50 MCG/ACT nasal spray Place 1 spray into both nostrils daily.   glipiZIDE (GLUCOTROL XL) 5 MG 24 hr tablet Take 1 tablet (5 mg total) by mouth daily with breakfast.   glucose blood (ONETOUCH VERIO) test strip USE TO CHECK BLOOD SUGARS ONCE DAILY   Insulin Pen Needle (PEN NEEDLES) 31G X 6 MM MISC 1 Application by Does not apply route daily at 12  noon.   metFORMIN (GLUCOPHAGE-XR) 750 MG 24 hr tablet Take 1 tablet (750 mg total) by mouth 2 (two) times daily after a meal.   norgestimate-ethinyl estradiol (ORTHO-CYCLEN) 0.25-35 MG-MCG tablet Take 1 tablet by mouth daily.   OneTouch Delica Lancets 33G MISC USE TO CHECK BLOOD SUGAR DAILY   PREVIDENT 5000 SENSITIVE 1.1-5 % GEL See admin instructions.   Probiotic Product (PROBIOTIC DAILY) CAPS Take 1 capsule by mouth daily.   [DISCONTINUED] liraglutide (VICTOZA) 18 MG/3ML SOPN Inject 1.8 mg into the skin daily. No additional refill without appointment with pcp   [DISCONTINUED] lisinopril (ZESTRIL) 5 MG tablet Take 1 tablet (5 mg total) by mouth daily.   No facility-administered medications prior to visit.    Review of Systems      Objective:  BP (!) 150/80 (BP Location: Left Arm, Patient Position: Supine, Cuff Size: Large)   Pulse (!) 112   Temp (!) 97.5 F (36.4 C) (Temporal)   Ht 5\' 5"  (1.651 m)   Wt 207 lb 6.4 oz (94.1 kg)   LMP 09/15/2023   SpO2 97%   BMI 34.51 kg/m     Physical Exam   No results found for any visits on 10/04/23.    Assessment & Plan:    Routine Health Maintenance and Physical Exam  Immunization History  Administered Date(s) Administered   Fluad Quad(high Dose 65+) 04/12/2022   HPV 9-valent 10/15/2017, 12/17/2017, 04/29/2018   Hepatitis B, ADULT 09/26/2013, 10/27/2013, 03/30/2014   Influenza, Mdck, Trivalent,PF 6+ MOS(egg free) 04/08/2023   Influenza,inj,Quad PF,6+ Mos 04/24/2017,  03/25/2018, 03/19/2019   Influenza-Unspecified 04/10/2020, 04/06/2021, 04/08/2023   PNEUMOCOCCAL CONJUGATE-20 10/04/2023   PPD Test 02/06/2012   Pneumococcal Polysaccharide-23 08/13/2017   Td 11/19/2003   Tdap 05/18/2017    Health Maintenance  Topic Date Due   HEMOGLOBIN A1C  10/24/2023   FOOT EXAM  01/17/2024   INFLUENZA VACCINE  02/01/2024   OPHTHALMOLOGY EXAM  04/03/2024   Diabetic kidney evaluation - eGFR measurement  04/24/2024   Diabetic kidney evaluation - Urine ACR  04/24/2024   Cervical Cancer Screening (HPV/Pap Cotest)  01/26/2026   DTaP/Tdap/Td (3 - Td or Tdap) 05/19/2027   Pneumococcal Vaccine 88-58 Years old  Completed   HPV VACCINES  Completed   Hepatitis C Screening  Completed   HIV Screening  Completed   COVID-19 Vaccine  Discontinued    Discussed health benefits of physical activity, and encouraged her to engage in regular exercise appropriate for her age and condition.  Problem List Items Addressed This Visit     HTN (hypertension), benign   BP not at goal Reports compliance with lisinopril 5mg  and amlodipine 5mg  BP Readings from Last 3 Encounters:  10/04/23 (!) 150/80  06/20/23 137/84  04/25/23 138/80    Increase lisinopril to 10mg  every day Maintain amlodipine dose Repeat CMP F/up in 1months      Relevant Medications   lisinopril (ZESTRIL) 10 MG tablet   Other Relevant Orders   EKG 12-Lead (Completed)   Hyperlipidemia associated with type 2 diabetes mellitus (HCC)   Last LDL at 6, but trig at 333. Current use of zetia and fenofibrate,  hx of statin allergic reaction Advised to maintain a mediterranean diet Repeat lipid panel and CMP F/up in 3months      Relevant Medications   lisinopril (ZESTRIL) 10 MG tablet   liraglutide (VICTOZA) 18 MG/3ML SOPN   Other Relevant Orders   Lipid panel   Tachycardia   Chronic, asymptomatic, previous HR 99-110 Echo 2021:  normal Check THYROID, CBC, CMP and ECG today Advised to start daily exercise and  stop caffeine consumption      Relevant Orders   EKG 12-Lead (Completed)   TSH   T4, free   CBC   Type 2 diabetes mellitus with hyperglycemia, without long-term current use of insulin (HCC)   Switch mounjaro to victoza due to cost Uncontrolled glucose with last hgbA1 at 9% Current use of victoza and metformin UACr: 16.33mg /g, 11.12mg /g, and 24.16mg /g Current use of and ACE-I Last LDL at 6, but trig at 333. Current use of zetia and fenofibrate, hx of statin allergic reaction  Repeat hgbA1c, CMP, and lipid panel Maintain current med dose F/up in 3months      Relevant Medications   lisinopril (ZESTRIL) 10 MG tablet   liraglutide (VICTOZA) 18 MG/3ML SOPN   Other Relevant Orders   Microalbumin / creatinine urine ratio   Comprehensive metabolic panel with GFR   Other Visit Diagnoses       Encounter for preventative adult health care exam with abnormal findings    -  Primary   Relevant Orders   Comprehensive metabolic panel with GFR     Immunization due       Relevant Orders   Pneumococcal conjugate vaccine 20-valent (Prevnar 20) (Completed)      Return in about 4 weeks (around 11/01/2023) for HTN and tachycardia.     [name], NP

## 2023-10-04 NOTE — Patient Instructions (Signed)
 Go to lab Maintain Heart healthy diet and daily exercise. Maintain current medications. Increase lisinopril to 10mg  daily Avoid all caffeine

## 2023-10-04 NOTE — Assessment & Plan Note (Signed)
 Switch mounjaro to victoza due to cost Uncontrolled glucose with last hgbA1 at 9% Current use of victoza and metformin UACr: 16.33mg /g, 11.12mg /g, and 24.16mg /g Current use of and ACE-I Last LDL at 6, but trig at 333. Current use of zetia and fenofibrate, hx of statin allergic reaction  Repeat hgbA1c, CMP, and lipid panel Maintain current med dose F/up in 3months

## 2023-10-04 NOTE — Assessment & Plan Note (Signed)
>>  ASSESSMENT AND PLAN FOR TYPE 2 DIABETES MELLITUS WITH HYPERGLYCEMIA, WITHOUT LONG-TERM CURRENT USE OF INSULIN (HCC) WRITTEN ON 10/04/2023 12:47 PM BY Kiren Mcisaac LUM, NP  Switch mounjaro  to victoza  due to cost Uncontrolled glucose with last hgbA1 at 9% Current use of victoza  and metformin  UACr: 16.33mg /g, 11.12mg /g, and 24.16mg /g Current use of and ACE-I Last LDL at 6, but trig at 333. Current use of zetia  and fenofibrate , hx of statin allergic reaction  Repeat hgbA1c, CMP, and lipid panel Maintain current med dose F/up in 3months

## 2023-10-04 NOTE — Assessment & Plan Note (Addendum)
 Last LDL at 6, but trig at 333. Current use of zetia and fenofibrate,  hx of statin allergic reaction Advised to maintain a mediterranean diet Repeat lipid panel and CMP F/up in 3months

## 2023-10-04 NOTE — Assessment & Plan Note (Signed)
 BP not at goal Reports compliance with lisinopril 5mg  and amlodipine 5mg  BP Readings from Last 3 Encounters:  10/04/23 (!) 150/80  06/20/23 137/84  04/25/23 138/80    Increase lisinopril to 10mg  every day Maintain amlodipine dose Repeat CMP F/up in 1months

## 2023-10-07 ENCOUNTER — Encounter: Payer: Self-pay | Admitting: Nurse Practitioner

## 2023-10-07 ENCOUNTER — Other Ambulatory Visit: Payer: Self-pay | Admitting: Nurse Practitioner

## 2023-10-07 DIAGNOSIS — E1169 Type 2 diabetes mellitus with other specified complication: Secondary | ICD-10-CM

## 2023-10-07 MED ORDER — FENOFIBRATE 160 MG PO TABS: 160.0000 mg | ORAL_TABLET | Freq: Every day | ORAL | 3 refills | Status: DC

## 2023-10-07 MED ORDER — FENOFIBRATE 160 MG PO TABS: 160.0000 mg | ORAL_TABLET | Freq: Every day | ORAL | 1 refills | Status: AC

## 2023-10-16 ENCOUNTER — Other Ambulatory Visit: Payer: Self-pay | Admitting: Nurse Practitioner

## 2023-10-16 DIAGNOSIS — I10 Essential (primary) hypertension: Secondary | ICD-10-CM

## 2023-10-16 MED ORDER — AMLODIPINE BESYLATE 5 MG PO TABS
5.0000 mg | ORAL_TABLET | Freq: Every day | ORAL | 3 refills | Status: DC
Start: 2023-10-16 — End: 2024-02-11

## 2023-10-16 NOTE — Telephone Encounter (Signed)
 Copied from CRM [id number]. Topic: Clinical - Medication Refill >> Oct 16, 2023  8:42 AM Jenice Mitts wrote: Most Recent Primary Care Visit:  Provider: Kandace Organ  Department: [facility]  Visit Type: PHYSICAL  Date: 10/04/2023  Medication: amLODipine (NORVASC) 5 MG tablet    Has the patient contacted their pharmacy? Yes (Agent: If no, request that the patient contact the pharmacy for the refill. If patient does not wish to contact the pharmacy document the reason why and proceed with request.) (Agent: If yes, when and what did the pharmacy advise?)  Is this the correct pharmacy for this prescription? Yes If no, delete pharmacy and type the correct one.  This is the patient's preferred pharmacy:  [facility] [address] [place] - [address] [place] [address] Phone: [phone] Fax: [phone]   Has the prescription been filled recently? No  Is the patient out of the medication? No, has until thursday  Has the patient been seen for an appointment in the last year OR does the patient have an upcoming appointment? Yes  Can we respond through MyChart? Yes  Agent: Please be advised that Rx refills may take up to 3 business days. We ask that you follow-up with your pharmacy.

## 2023-11-02 ENCOUNTER — Other Ambulatory Visit: Payer: Self-pay | Admitting: Nurse Practitioner

## 2023-11-02 DIAGNOSIS — E1165 Type 2 diabetes mellitus with hyperglycemia: Secondary | ICD-10-CM

## 2023-11-07 NOTE — Telephone Encounter (Signed)
 Medication: Liraglutide  (Victoza ) 18 mg/3mL  Directions: Inject into skin daily  Last given: 10/04/23 Number refills: 0 Last o/v: 10/04/23 Follow up: 4 weeks-Scheduled for 11/08/23 Labs: 04/25/23

## 2023-11-08 ENCOUNTER — Ambulatory Visit: Payer: PRIVATE HEALTH INSURANCE | Admitting: Nurse Practitioner

## 2023-11-08 ENCOUNTER — Encounter: Payer: Self-pay | Admitting: Nurse Practitioner

## 2023-11-08 VITALS — BP 132/90 | HR 112 | Temp 97.8°F | Ht 65.0 in | Wt 204.4 lb

## 2023-11-08 DIAGNOSIS — R000 Tachycardia, unspecified: Secondary | ICD-10-CM

## 2023-11-08 DIAGNOSIS — D649 Anemia, unspecified: Secondary | ICD-10-CM

## 2023-11-08 DIAGNOSIS — E1165 Type 2 diabetes mellitus with hyperglycemia: Secondary | ICD-10-CM

## 2023-11-08 DIAGNOSIS — I10 Essential (primary) hypertension: Secondary | ICD-10-CM | POA: Diagnosis not present

## 2023-11-08 DIAGNOSIS — E785 Hyperlipidemia, unspecified: Secondary | ICD-10-CM

## 2023-11-08 DIAGNOSIS — E1169 Type 2 diabetes mellitus with other specified complication: Secondary | ICD-10-CM | POA: Diagnosis not present

## 2023-11-08 HISTORY — DX: Anemia, unspecified: D64.9

## 2023-11-08 LAB — CBC WITH DIFFERENTIAL/PLATELET
Basophils Absolute: 0 10*3/uL (ref 0.0–0.1)
Basophils Relative: 0.3 % (ref 0.0–3.0)
Eosinophils Absolute: 0.1 10*3/uL (ref 0.0–0.7)
Eosinophils Relative: 0.5 % (ref 0.0–5.0)
HCT: 33.6 % — ABNORMAL LOW (ref 36.0–46.0)
Hemoglobin: 11.4 g/dL — ABNORMAL LOW (ref 12.0–15.0)
Lymphocytes Relative: 12.4 % (ref 12.0–46.0)
Lymphs Abs: 1.3 10*3/uL (ref 0.7–4.0)
MCHC: 34 g/dL (ref 30.0–36.0)
MCV: 86.7 fl (ref 78.0–100.0)
Monocytes Absolute: 0.5 10*3/uL (ref 0.1–1.0)
Monocytes Relative: 4.5 % (ref 3.0–12.0)
Neutro Abs: 8.9 10*3/uL — ABNORMAL HIGH (ref 1.4–7.7)
Neutrophils Relative %: 82.3 % — ABNORMAL HIGH (ref 43.0–77.0)
Platelets: 517 10*3/uL — ABNORMAL HIGH (ref 150.0–400.0)
RBC: 3.87 Mil/uL (ref 3.87–5.11)
RDW: 13.4 % (ref 11.5–15.5)
WBC: 10.8 10*3/uL — ABNORMAL HIGH (ref 4.0–10.5)

## 2023-11-08 LAB — IRON,TIBC AND FERRITIN PANEL
%SAT: 11 % — ABNORMAL LOW (ref 16–45)
Ferritin: 13 ng/mL — ABNORMAL LOW (ref 16–154)
Iron: 73 ug/dL (ref 40–190)
TIBC: 657 ug/dL — ABNORMAL HIGH (ref 250–450)

## 2023-11-08 LAB — B12 AND FOLATE PANEL
Folate: >25.2 ng/mL (ref >5.9)
Vitamin B-12: 458 pg/mL (ref 211–911)

## 2023-11-08 LAB — HEMOGLOBIN A1C: Hgb A1c MFr Bld: 7.7 % — ABNORMAL HIGH (ref 4.6–6.5)

## 2023-11-08 MED ORDER — METFORMIN HCL ER 750 MG PO TB24: 750.0000 mg | ORAL_TABLET | Freq: Two times a day (BID) | ORAL | 3 refills | Status: DC

## 2023-11-08 MED ORDER — EZETIMIBE 10 MG PO TABS: 10.0000 mg | ORAL_TABLET | Freq: Every day | ORAL | 1 refills | Status: DC

## 2023-11-08 NOTE — Assessment & Plan Note (Signed)
 Chronic, asymptomatic, previous HR 99-110 Repeat ECG 10/2023: S. Tachycardia, no change compared to 2023 ECG. Possibly related to anemia and sedentary lifestyle?  Encouraged to maintain daily exercise with the goal of weekly Repeat CBC,  Check iron, B12, folate, and ifob

## 2023-11-08 NOTE — Progress Notes (Signed)
 Established Patient Visit  Patient: Laura Miller   DOB: [birth year]   38 y.o. Female  MRN: [id number] Visit Date: 11/08/2023  Subjective:     Chief Complaint  Patient presents with   Follow-up    4 week follow up for HTN and iron levels checked    HPI  Tachycardia Chronic, asymptomatic, previous HR 99-110 Repeat ECG 10/2023: S. Tachycardia, no change compared to 2023 ECG. Possibly related to anemia and sedentary lifestyle?  Encouraged to maintain daily exercise with the goal of weekly Repeat CBC,  Check iron, B12, folate, and ifob  Anemia Chronic, first noted 38yrs ago, no previous workup asymptomatic No hx of menorrhagia, LMP 10/13/2023, cycle every 28days, current use of COC. Repeat CBC,  Check iron, B12, folate, and ifob  HTN (hypertension), benign Improved BP with lisinopril  10mg  and amlodipine  5mg  BP Readings from Last 3 Encounters:  11/08/23 (!) 132/90  10/04/23 (!) 150/80  06/20/23 137/84    Maintain med doses Encouraged to maintain DASH diet and daily exercise F/up in 38month   Reviewed medical, surgical, and social history today  Medications: Outpatient Medications Prior to Visit  Medication Sig   amLODipine  (NORVASC ) 5 MG tablet Take 1 tablet (5 mg total) by mouth daily.   ezetimibe  (ZETIA ) 10 MG tablet TAKE 1 TABLET(10 MG) BY MOUTH DAILY   fenofibrate  160 MG tablet Take 1 tablet (160 mg total) by mouth daily.   fluticasone (FLONASE) 50 MCG/ACT nasal spray Place 1 spray into both nostrils daily.   glipiZIDE  (GLUCOTROL  XL) 5 MG 24 hr tablet Take 1 tablet (5 mg total) by mouth daily with breakfast.   glucose blood (ONETOUCH VERIO) test strip USE TO CHECK BLOOD SUGARS ONCE DAILY   Insulin Pen Needle (PEN NEEDLES) 31G X 6 MM MISC 1 Application by Does not apply route daily at 12 noon.   liraglutide  (VICTOZA ) 18 MG/3ML SOPN ADMINISTER 1.8 MG UNDER THE SKIN DAILY   lisinopril  (ZESTRIL ) 10 MG tablet Take 1 tablet (10 mg total) by mouth  daily.   metFORMIN  (GLUCOPHAGE -XR) 750 MG 24 hr tablet Take 1 tablet (750 mg total) by mouth 2 (two) times daily after a meal.   norgestimate -ethinyl estradiol  (ORTHO-CYCLEN) 0.25-35 MG-MCG tablet Take 1 tablet by mouth daily.   OneTouch Delica Lancets 33G MISC USE TO CHECK BLOOD SUGAR DAILY   PREVIDENT 5000 SENSITIVE 1.1-5 % GEL See admin instructions.   Probiotic Product (PROBIOTIC DAILY) CAPS Take 1 capsule by mouth daily.   No facility-administered medications prior to visit.   Reviewed past medical and social history.   ROS per HPI above      Objective:  BP (!) 132/90 (BP Location: Left Arm, Patient Position: Supine, Cuff Size: Normal)   Pulse (!) 112   Temp 97.8 F (36.6 C) (Temporal)   Ht 5\' 5"  (1.651 m)   Wt 204 lb 6.4 oz (92.7 kg)   LMP 10/13/2023 (Exact Date)   SpO2 96%   BMI 34.01 kg/m      Physical Exam Vitals and nursing note reviewed.  Cardiovascular:     Rate and Rhythm: Normal rate and regular rhythm.     Pulses: Normal pulses.     Heart sounds: Normal heart sounds.  Pulmonary:     Effort: Pulmonary effort is normal.     Breath sounds: Normal breath sounds.  Musculoskeletal:     Right lower leg: No edema.  Left lower leg: No edema.  Neurological:     Mental Status: She is alert and oriented to person, place, and time.     No results found for any visits on 11/08/23.    Assessment & Plan:    Problem List Items Addressed This Visit     Anemia - Primary   Chronic, first noted 38yrs ago, no previous workup asymptomatic No hx of menorrhagia, LMP 10/13/2023, cycle every 28days, current use of COC. Repeat CBC,  Check iron, B12, folate, and ifob      Relevant Orders   Iron, TIBC and Ferritin Panel   B12 and Folate Panel   CBC with Differential/Platelet   Fecal occult blood, imunochemical   HTN (hypertension), benign   Improved BP with lisinopril  10mg  and amlodipine  5mg  BP Readings from Last 3 Encounters:  11/08/23 (!) 132/90  10/04/23 (!)  150/80  06/20/23 137/84    Maintain med doses Encouraged to maintain DASH diet and daily exercise F/up in 38month      Tachycardia   Chronic, asymptomatic, previous HR 99-110 Repeat ECG 10/2023: S. Tachycardia, no change compared to 2023 ECG. Possibly related to anemia and sedentary lifestyle?  Encouraged to maintain daily exercise with the goal of weekly Repeat CBC,  Check iron, B12, folate, and ifob      Relevant Orders   Iron, TIBC and Ferritin Panel   B12 and Folate Panel   CBC with Differential/Platelet   Type 2 diabetes mellitus with hyperglycemia, without long-term current use of insulin (HCC)   Return in about 3 months (around 02/08/2024) for HTN, DM, hyperlipidemia (fasting).     Kathrene Parents, NP

## 2023-11-08 NOTE — Assessment & Plan Note (Signed)
 Improved BP with lisinopril  10mg  and amlodipine  5mg  BP Readings from Last 3 Encounters:  11/08/23 (!) 132/90  10/04/23 (!) 150/80  06/20/23 137/84    Maintain med doses Encouraged to maintain DASH diet and daily exercise F/up in 14month

## 2023-11-08 NOTE — Patient Instructions (Addendum)
 Maintain med dose and DASH diet Return stool sample to lab Go to lab  How to Increase Your Level of Physical Activity Getting regular physical activity is important for your overall health and well-being. Most people do not get enough exercise. There are easy ways to increase your level of physical activity, even if you have not been very active in the past or if you are just starting out. What are the benefits of physical activity? Physical activity has many short-term and long-term benefits. Being active on a regular basis can improve your physical and mental health as well as provide other benefits. Physical health benefits Helping you lose weight or maintain a healthy weight. Strengthening your muscles and bones. Reducing your risk of certain long-term (chronic) diseases, including heart disease, cancer, and diabetes. Being able to move around more easily and for longer periods of time without getting tired (increased endurance or stamina). Improving your ability to fight off illness (enhanced immunity). Being able to sleep better. Helping you stay healthy as you get older, including: Helping you stay mobile, or capable of walking and moving around. Preventing accidents, such as falls. Increasing life expectancy. Mental health benefits Boosting your mood and improving your self-esteem. Lowering your chance of having mental health problems, such as depression or anxiety. Helping you feel good about your body. Other benefits Finding new sources of fun and enjoyment. Meeting new people who share a common interest. Before you begin If you have a chronic illness or have not been active for a while, check with your health care provider about how to get started. Ask your health care provider what activities are safe for you. Start out slowly. Walking or doing some simple chair exercises is a good place to start, especially if you have not been active before or for a long time. Set goals  that you can work toward. Ask your health care provider how much exercise is best for you. In general, most adults should: Do moderate-intensity exercise for at least 150 minutes each week (30 minutes on most days of the week) or vigorous exercise for at least 75 minutes each week, or a combination of these. Moderate-intensity exercise can include walking at a quick pace, biking, yoga, water aerobics, or gardening. Vigorous exercise involves activities that take more effort, such as jogging or running, playing sports, swimming laps, or jumping rope. Do strength exercises on at least 2 days each week. This can include weight lifting, body weight exercises, and resistance-band exercises. How to be more physically active Make a plan  Try to find activities that you enjoy. You are more likely to commit to an exercise routine if it does not feel like a chore. If you have bone or joint problems, choose low-impact exercises, like walking or swimming. Use these tips for being successful with an exercise plan: Find a workout partner for accountability. Join a group or class, such as an aerobics class, cycling class, or sports team. Make family time active. Go for a walk, bike, or swim. Include a variety of exercises each week. Consider using a fitness tracker, such as a mobile phone app or a device worn like a watch, that will count the number of steps you take each day. Many people strive to reach 10,000 steps a day. Find ways to be active in your daily routines Besides your formal exercise plans, you can find ways to do physical activity during your daily routines, such as: Walking or biking to work or to the store.  Taking the stairs instead of the elevator. Parking farther away from the door at work or at the store. Planning walking meetings. Walking around while you are on the phone. Where to find more information Centers for Disease Control and Prevention:  [employer] on Fitness, Sports & Nutrition: www.fitness.gov ChooseMyPlate: http://www.harvey.com/ Contact a health care provider if: You have headaches, muscle aches, or joint pain that is concerning. You feel dizzy or light-headed while exercising. You faint. You feel your heart skipping, racing, or fluttering. You have chest pain while exercising. Summary Exercise benefits your mind and body at any age, even if you are just starting out. If you have a chronic illness or have not been active for a while, check with your health care provider before increasing your physical activity. Choose activities that are safe and enjoyable for you. Ask your health care provider what activities are safe for you. Start slowly. Tell your health care provider if you have problems as you start to increase your activity level. This information is not intended to replace advice given to you by your health care provider. Make sure you discuss any questions you have with your health care provider. Document Revised: 10/15/2020 Document Reviewed: 10/15/2020 Elsevier Patient Education  2024 [employer].

## 2023-11-08 NOTE — Assessment & Plan Note (Signed)
 Chronic, first noted 83yrs ago, no previous workup asymptomatic No hx of menorrhagia, LMP 10/13/2023, cycle every 28days, current use of COC. Repeat CBC,  Check iron, B12, folate, and ifob

## 2023-11-15 ENCOUNTER — Ambulatory Visit: Payer: Self-pay | Admitting: Nurse Practitioner

## 2023-11-15 DIAGNOSIS — E1169 Type 2 diabetes mellitus with other specified complication: Secondary | ICD-10-CM

## 2023-11-15 MED ORDER — GLIPIZIDE ER 10 MG PO TB24: 10.0000 mg | ORAL_TABLET | Freq: Every day | ORAL | 1 refills | Status: DC

## 2023-12-06 ENCOUNTER — Other Ambulatory Visit: Payer: Self-pay | Admitting: Nurse Practitioner

## 2023-12-06 DIAGNOSIS — E1165 Type 2 diabetes mellitus with hyperglycemia: Secondary | ICD-10-CM

## 2023-12-18 ENCOUNTER — Other Ambulatory Visit: Payer: PRIVATE HEALTH INSURANCE

## 2023-12-18 NOTE — Progress Notes (Signed)
     12/18/2023   Patient ID: Valentin Gaskins, female   DOB: April 20, 1986, 38 y.o.   MRN: [id number]   Subjective/objective Telephone visit to follow-up on management of chronic disease states   Diabetes management plan -Current medications: Glipizide  XL 10 mg daily with breakfast, Victoza  1.8 mg daily, metformin  XR 750 mg twice daily after meals -Tolerating Victoza  well; other GLP1 medications too expensive on patient's insurance -Patient dose monitor FBG and post-prandial BG regularly and states readings vary 150-200 throughout the day -Does not endorse any signs or symptoms of hypoglycemia or hyperglycemia -A1c 5/8 7.7%, down from 9.0 %on 04/25/2023  Hypertension -Current medications:  amlodipine  5mg  daily, lisinopril  10mg  -Lisinopril  increased from 5mg  to 10mg  in April -Patient does have home BP monitor but does not monitor BP regularly -Last OV reading 132/90 on 5/8, 150/80 on 4/3  Hyperlipidemia  -Current medications:  ezetimibe  10mg  daily, fenofibrate  160mg  daily -Pravastatin  caused itching and skin rash in the past -LDL 74 on 4/3, up from 67 previously; but TG decreased to 164 from 333   Assessment/plan   Diabetes management plan -Continue current regimen and regular monitoring and recording of blood glucose -Continue lifestyle modifications including dietary changes and increased exercise -Patient sees PCP again in August and is due for A1c-if >7%, could attempt to increase metformin  XR to 1000mg  BID  Hypertension -Improved control -Continue current regimen at this time -Begin checking home BP at least 2-3x/week and bring readings to next PCP visit -If BP consistently >130/80, could increase amlodipine  to 10mg  daily or lisinopril  to 20mg  daily  Hyperlipidemia -Continue current regimen at this time -Continue lifestyle modifications including dietary changes and increased exercise -Could consider initiation of fish oil in the future if needed for additional LDL and TG  control   Follow-up: 3 months   Linn Rich, PharmD, DPLA

## 2024-01-11 ENCOUNTER — Other Ambulatory Visit: Payer: Self-pay | Admitting: Nurse Practitioner

## 2024-01-11 DIAGNOSIS — E1165 Type 2 diabetes mellitus with hyperglycemia: Secondary | ICD-10-CM

## 2024-01-11 NOTE — Telephone Encounter (Signed)
Responded to in another encounter.

## 2024-01-17 ENCOUNTER — Other Ambulatory Visit: Payer: Self-pay | Admitting: Nurse Practitioner

## 2024-01-17 DIAGNOSIS — E1165 Type 2 diabetes mellitus with hyperglycemia: Secondary | ICD-10-CM

## 2024-01-17 DIAGNOSIS — I10 Essential (primary) hypertension: Secondary | ICD-10-CM

## 2024-02-05 ENCOUNTER — Other Ambulatory Visit: Payer: Self-pay | Admitting: Nurse Practitioner

## 2024-02-05 DIAGNOSIS — E1169 Type 2 diabetes mellitus with other specified complication: Secondary | ICD-10-CM

## 2024-02-10 ENCOUNTER — Other Ambulatory Visit: Payer: Self-pay | Admitting: Nurse Practitioner

## 2024-02-10 DIAGNOSIS — E1165 Type 2 diabetes mellitus with hyperglycemia: Secondary | ICD-10-CM

## 2024-02-11 ENCOUNTER — Ambulatory Visit (INDEPENDENT_AMBULATORY_CARE_PROVIDER_SITE_OTHER): Payer: PRIVATE HEALTH INSURANCE | Admitting: Nurse Practitioner

## 2024-02-11 ENCOUNTER — Encounter: Payer: Self-pay | Admitting: Nurse Practitioner

## 2024-02-11 VITALS — BP 150/100 | HR 107 | Temp 98.5°F | Ht 65.0 in | Wt 206.0 lb

## 2024-02-11 DIAGNOSIS — E1165 Type 2 diabetes mellitus with hyperglycemia: Secondary | ICD-10-CM

## 2024-02-11 DIAGNOSIS — Z7984 Long term (current) use of oral hypoglycemic drugs: Secondary | ICD-10-CM

## 2024-02-11 DIAGNOSIS — R0683 Snoring: Secondary | ICD-10-CM

## 2024-02-11 DIAGNOSIS — Z794 Long term (current) use of insulin: Secondary | ICD-10-CM

## 2024-02-11 DIAGNOSIS — E1169 Type 2 diabetes mellitus with other specified complication: Secondary | ICD-10-CM

## 2024-02-11 DIAGNOSIS — D500 Iron deficiency anemia secondary to blood loss (chronic): Secondary | ICD-10-CM | POA: Diagnosis not present

## 2024-02-11 DIAGNOSIS — E785 Hyperlipidemia, unspecified: Secondary | ICD-10-CM | POA: Diagnosis not present

## 2024-02-11 DIAGNOSIS — I10 Essential (primary) hypertension: Secondary | ICD-10-CM

## 2024-02-11 LAB — LDL CHOLESTEROL, DIRECT: Direct LDL: 81 mg/dL

## 2024-02-11 LAB — COMPREHENSIVE METABOLIC PANEL WITH GFR
ALT: 16 U/L (ref 0–35)
AST: 16 U/L (ref 0–37)
Albumin: 4.2 g/dL (ref 3.5–5.2)
Alkaline Phosphatase: 43 U/L (ref 39–117)
BUN: 11 mg/dL (ref 6–23)
CO2: 25 meq/L (ref 19–32)
Calcium: 9.6 mg/dL (ref 8.4–10.5)
Chloride: 101 meq/L (ref 96–112)
Creatinine, Ser: 0.87 mg/dL (ref 0.40–1.20)
GFR: 85 mL/min (ref >60.00)
Glucose, Bld: 196 mg/dL — ABNORMAL HIGH (ref 70–99)
Potassium: 4 meq/L (ref 3.5–5.1)
Sodium: 138 meq/L (ref 135–145)
Total Bilirubin: 0.2 mg/dL (ref 0.2–1.2)
Total Protein: 7.5 g/dL (ref 6.0–8.3)

## 2024-02-11 LAB — HEMOGLOBIN A1C: Hgb A1c MFr Bld: 7.8 % — ABNORMAL HIGH (ref 4.6–6.5)

## 2024-02-11 LAB — IRON,TIBC AND FERRITIN PANEL
%SAT: 28 % (ref 16–45)
Ferritin: 13 ng/mL — ABNORMAL LOW (ref 16–154)
Iron: 171 ug/dL (ref 40–190)
TIBC: 608 ug/dL — ABNORMAL HIGH (ref 250–450)

## 2024-02-11 MED ORDER — LISINOPRIL 20 MG PO TABS: 20.0000 mg | ORAL_TABLET | Freq: Every day | ORAL | 1 refills | Status: DC

## 2024-02-11 MED ORDER — AMLODIPINE BESYLATE 10 MG PO TABS: 10.0000 mg | ORAL_TABLET | Freq: Every evening | ORAL | 1 refills | Status: DC

## 2024-02-11 NOTE — Assessment & Plan Note (Signed)
>>  ASSESSMENT AND PLAN FOR TYPE 2 DIABETES MELLITUS WITH HYPERGLYCEMIA, WITHOUT LONG-TERM CURRENT USE OF INSULIN (HCC) WRITTEN ON 02/11/2024 10:02 AM BY Annaleise Burger LUM, NP  No adverse effects with victoza  and glipizide   Repeat hgbA1c, CMP, and LDL

## 2024-02-11 NOTE — Patient Instructions (Signed)
 Go to lab Maintain Heart healthy diet and daily exercise. Increase lisinopril  to 20mg  in AM and amlodipine  to 10mg  in PM You will be contacted to schedule appointment with sleep specialist

## 2024-02-11 NOTE — Progress Notes (Signed)
 Established Patient Visit  Patient: Laura Miller   DOB: 09-29-1985   38 y.o. Female  MRN: [id number] Visit Date: 02/11/2024  Subjective:    Chief Complaint  Patient presents with   Follow-up    3 month follow for HTN    HPI HTN (hypertension), benign BP not at goal with lisinopril  10mg  and amlodipine  5mg  Reports DASH diet No exercise regimen Chronic sinus tachycardia-asymptomatic, echo completed 2021: normal BP Readings from Last 3 Encounters:  02/11/24 (!) 150/100  11/08/23 (!) 132/90  10/04/23 (!) 150/80    Increase lisinopril  to 20mg  and amlodipine  to 10mg  Encouraged to start daily exercise Entered referral to [facility]-sleep specialist. F/up in 38month  Type 2 diabetes mellitus with hyperglycemia, without long-term current use of insulin (HCC) No adverse effects with victoza  and glipizide   Repeat hgbA1c, CMP, and LDL  Anemia Secondary to menstrual cycle, current use of COC and OTC ferrous sulfate Normal B12 and folate  Repeat iron panel  Hyperlipidemia associated with type 2 diabetes mellitus (HCC) Repeat lipid panel and CMP today Maintain zetia  dose   Reviewed medical, surgical, and social history today  Medications: Outpatient Medications Prior to Visit  Medication Sig   ezetimibe  (ZETIA ) 10 MG tablet Take 1 tablet (10 mg total) by mouth daily.   fenofibrate  160 MG tablet Take 1 tablet (160 mg total) by mouth daily.   fluticasone (FLONASE) 50 MCG/ACT nasal spray Place 1 spray into both nostrils daily.   glipiZIDE  (GLUCOTROL  XL) 10 MG 24 hr tablet Take 1 tablet (10 mg total) by mouth daily with breakfast.   glucose blood (ONETOUCH VERIO) test strip USE TO CHECK BLOOD SUGARS ONCE DAILY   Insulin Pen Needle (PEN NEEDLES) 31G X 6 MM MISC 1 Application by Does not apply route daily at 12 noon.   liraglutide  (VICTOZA ) 18 MG/3ML SOPN ADMINISTER 1.8 MG UNDER THE SKIN DAILY   metFORMIN  (GLUCOPHAGE -XR) 750 MG 24 hr tablet TAKE 1 TABLET(750 MG) BY MOUTH  DAILY WITH BREAKFAST (Patient taking differently: Take by mouth 2 (two) times daily with a meal.)   norgestimate -ethinyl estradiol  (ORTHO-CYCLEN) 0.25-35 MG-MCG tablet Take 1 tablet by mouth daily.   OneTouch Delica Lancets 33G MISC USE TO CHECK BLOOD SUGAR DAILY   PREVIDENT 5000 SENSITIVE 1.1-5 % GEL See admin instructions.   Probiotic Product (PROBIOTIC DAILY) CAPS Take 1 capsule by mouth daily.   [DISCONTINUED] amLODipine  (NORVASC ) 5 MG tablet Take 1 tablet (5 mg total) by mouth daily.   [DISCONTINUED] lisinopril  (ZESTRIL ) 10 MG tablet TAKE 1 TABLET(10 MG) BY MOUTH DAILY   No facility-administered medications prior to visit.   Reviewed past medical and social history.   ROS per HPI above      Objective:  BP (!) 150/100 (BP Location: Left Arm, Patient Position: Sitting, Cuff Size: Normal)   Pulse (!) 107   Temp 98.5 F (36.9 C) (Oral)   Ht 5' 5 (1.651 m)   Wt 206 lb (93.4 kg)   LMP 02/02/2024   SpO2 99%   BMI 34.28 kg/m      Physical Exam Vitals and nursing note reviewed.  Cardiovascular:     Rate and Rhythm: Regular rhythm. Tachycardia present.     Pulses: Normal pulses.     Heart sounds: Normal heart sounds.  Pulmonary:     Effort: Pulmonary effort is normal.     Breath sounds: Normal breath sounds.  Musculoskeletal:     Right  lower leg: No edema.     Left lower leg: No edema.  Neurological:     Mental Status: She is alert and oriented to person, place, and time.     No results found for any visits on 02/11/24.    Assessment & Plan:    Problem List Items Addressed This Visit     Anemia   Secondary to menstrual cycle, current use of COC and OTC ferrous sulfate Normal B12 and folate  Repeat iron panel      Relevant Orders   Iron, TIBC and Ferritin Panel   HTN (hypertension), benign - Primary   BP not at goal with lisinopril  10mg  and amlodipine  5mg  Reports DASH diet No exercise regimen Chronic sinus tachycardia-asymptomatic, echo completed 2021:  normal BP Readings from Last 3 Encounters:  02/11/24 (!) 150/100  11/08/23 (!) 132/90  10/04/23 (!) 150/80    Increase lisinopril  to 20mg  and amlodipine  to 10mg  Encouraged to start daily exercise Entered referral to [facility]-sleep specialist. F/up in 38month      Relevant Medications   lisinopril  (ZESTRIL ) 20 MG tablet   amLODipine  (NORVASC ) 10 MG tablet   Other Relevant Orders   Comprehensive metabolic panel with GFR   Ambulatory referral to Sleep Studies   Hyperlipidemia associated with type 2 diabetes mellitus (HCC)   Repeat lipid panel and CMP today Maintain zetia  dose      Relevant Medications   lisinopril  (ZESTRIL ) 20 MG tablet   amLODipine  (NORVASC ) 10 MG tablet   Other Relevant Orders   Comprehensive metabolic panel with GFR   Direct LDL   Type 2 diabetes mellitus with hyperglycemia, without long-term current use of insulin (HCC)   No adverse effects with victoza  and glipizide   Repeat hgbA1c, CMP, and LDL      Relevant Medications   lisinopril  (ZESTRIL ) 20 MG tablet   Other Relevant Orders   Hemoglobin A1c   Comprehensive metabolic panel with GFR   Other Visit Diagnoses       Loud snoring       Relevant Orders   Ambulatory referral to Sleep Studies      Return in about 4 weeks (around 03/10/2024) for HTN.     Roselie Mood, NP

## 2024-02-11 NOTE — Assessment & Plan Note (Signed)
 BP not at goal with lisinopril  10mg  and amlodipine  5mg  Reports DASH diet No exercise regimen Chronic sinus tachycardia-asymptomatic, echo completed 2021: normal BP Readings from Last 3 Encounters:  02/11/24 (!) 150/100  11/08/23 (!) 132/90  10/04/23 (!) 150/80    Increase lisinopril  to 20mg  and amlodipine  to 10mg  Encouraged to start daily exercise Entered referral to [facility]-sleep specialist. F/up in 89month

## 2024-02-11 NOTE — Assessment & Plan Note (Signed)
 No adverse effects with victoza  and glipizide   Repeat hgbA1c, CMP, and LDL

## 2024-02-11 NOTE — Assessment & Plan Note (Signed)
 Secondary to menstrual cycle, current use of COC and OTC ferrous sulfate Normal B12 and folate  Repeat iron panel

## 2024-02-11 NOTE — Assessment & Plan Note (Signed)
 Repeat lipid panel and CMP today Maintain zetia  dose

## 2024-02-12 ENCOUNTER — Ambulatory Visit: Payer: Self-pay | Admitting: Nurse Practitioner

## 2024-02-12 DIAGNOSIS — E1169 Type 2 diabetes mellitus with other specified complication: Secondary | ICD-10-CM

## 2024-02-12 MED ORDER — METFORMIN HCL ER (MOD) 1000 MG PO TB24: 1000.0000 mg | ORAL_TABLET | Freq: Two times a day (BID) | ORAL | 1 refills | Status: DC

## 2024-02-12 NOTE — Assessment & Plan Note (Signed)
 hgbA1c remains at 7.8%: maintain glipizide  and victoza  dose. Increase metformin  to 1000mg  BID with food F/up in 3months

## 2024-02-12 NOTE — Assessment & Plan Note (Signed)
>>  ASSESSMENT AND PLAN FOR TYPE 2 DIABETES MELLITUS WITH HYPERGLYCEMIA, WITHOUT LONG-TERM CURRENT USE OF INSULIN (HCC) WRITTEN ON 02/12/2024  1:05 PM BY Ry Moody LUM, NP  hgbA1c remains at 7.8%: maintain glipizide  and victoza  dose. Increase metformin  to 1000mg  BID with food F/up in 3months

## 2024-02-13 ENCOUNTER — Telehealth: Payer: Self-pay | Admitting: Nurse Practitioner

## 2024-02-13 NOTE — Telephone Encounter (Signed)
 Copied from CRM [id number]. Topic: Clinical - Medication Question >> Feb 13, 2024 11:18 AM Lavanda D wrote: Reason for CRM: [facility] calling regarding metFORMIN  (GLUMETZA ) 1000 MG (MOD) 24 hr tablet. modified tablets with insurance are $12000. Pharmacy is asking if the normal ER 100 would be okay instead of the modified.

## 2024-02-15 ENCOUNTER — Other Ambulatory Visit: Payer: Self-pay | Admitting: Nurse Practitioner

## 2024-02-15 DIAGNOSIS — E1165 Type 2 diabetes mellitus with hyperglycemia: Secondary | ICD-10-CM

## 2024-02-15 MED ORDER — METFORMIN HCL 1000 MG PO TABS: 1000.0000 mg | ORAL_TABLET | Freq: Two times a day (BID) | ORAL | 3 refills | Status: AC

## 2024-02-15 NOTE — Telephone Encounter (Signed)
 Called patient's pharmacy and spoke with Sasha and informed her that prescribing provider is okay to switch to Metformin  1000mg  ER brand which is affordable. She asked if we could send over a Rx with the change. I thanked her for taking my call and stated that a Rx will be sent over.

## 2024-02-15 NOTE — Telephone Encounter (Signed)
 Called and left a voice message per DPR for patient asking to give me a call back at the office at [phone] pertaining to her Metformin  Rx.

## 2024-02-17 ENCOUNTER — Other Ambulatory Visit: Payer: Self-pay | Admitting: Nurse Practitioner

## 2024-02-17 DIAGNOSIS — E1169 Type 2 diabetes mellitus with other specified complication: Secondary | ICD-10-CM

## 2024-02-19 ENCOUNTER — Encounter: Payer: Self-pay | Admitting: Nurse Practitioner

## 2024-02-19 DIAGNOSIS — E1165 Type 2 diabetes mellitus with hyperglycemia: Secondary | ICD-10-CM

## 2024-02-19 MED ORDER — LIRAGLUTIDE 18 MG/3ML ~~LOC~~ SOPN: 1.8000 mg | PEN_INJECTOR | Freq: Every day | SUBCUTANEOUS | 2 refills | Status: DC

## 2024-02-19 NOTE — Telephone Encounter (Signed)
 Requesting: Liraglutide  (Victoza ) 18 mg/3 mL  Directions: Inject under skin daily  Last Visit: 02/11/2024 Next Visit: 03/14/2024 Last Refill: 01/11/24  Please Advise

## 2024-02-19 NOTE — Telephone Encounter (Signed)
 Called patient's pharmacy and informed reason for my call. Sasha informed me that what was sent on 01/11/24 9 mL was a 30 day supply and if we are wanting to do a 90 day it would need to be 27 mL not 9mL

## 2024-02-20 NOTE — Telephone Encounter (Signed)
 Patient called and made aware her refill has been sent to her pharmacy. She thanked me for calling and stated that she received a text informing her it's ready for pick up

## 2024-03-12 ENCOUNTER — Ambulatory Visit (INDEPENDENT_AMBULATORY_CARE_PROVIDER_SITE_OTHER): Payer: PRIVATE HEALTH INSURANCE | Admitting: Neurology

## 2024-03-12 ENCOUNTER — Encounter: Payer: Self-pay | Admitting: Neurology

## 2024-03-12 VITALS — BP 148/98 | HR 121 | Ht 65.0 in | Wt 207.0 lb

## 2024-03-12 DIAGNOSIS — Z9189 Other specified personal risk factors, not elsewhere classified: Secondary | ICD-10-CM | POA: Diagnosis not present

## 2024-03-12 DIAGNOSIS — G4719 Other hypersomnia: Secondary | ICD-10-CM | POA: Diagnosis not present

## 2024-03-12 DIAGNOSIS — R351 Nocturia: Secondary | ICD-10-CM | POA: Diagnosis not present

## 2024-03-12 DIAGNOSIS — R0683 Snoring: Secondary | ICD-10-CM

## 2024-03-12 DIAGNOSIS — E66811 Obesity, class 1: Secondary | ICD-10-CM

## 2024-03-12 DIAGNOSIS — R519 Headache, unspecified: Secondary | ICD-10-CM

## 2024-03-12 NOTE — Progress Notes (Signed)
 Subjective:    Patient ID: Laura Miller is a 38 y.o. female.  HPI    True Mar, MD, PhD [facility] [address] [address] [place], [place] [address]  Dear Roselie,   I saw your patient, Laura Miller, upon your kind request in my sleep clinic today for initial consultation of her sleep disorder, in particular, concern for underlying obstructive sleep apnea.  The patient is unaccompanied today.  As you know, [name] is a 38 year old female with an underlying medical history of anemia, diabetes, hypertension, hyperlipidemia, and obesity, who reports snoring and excessive daytime somnolence.  Her Epworth sleepiness score is 5 out of 24 today, fatigue severity score is 22 out of 63.  She admits that she may not get enough sleep as she works 2 jobs.  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  I reviewed your office note from 02/11/2024.  She had an elevated blood pressure value at the time.  Current blood pressure is mildly elevated but not as high as it was back a month ago.  She has been on amlodipine  and her lisinopril  was increased.  She is single and lives alone, no pets in the house.  She goes to bed between midnight and 2 AM and rise time is typically around 7 AM.  She has had occasional morning headaches.  She sometimes has headaches prior to her menstrual cycle.  She takes over-the-counter medication.  She is working on weight loss.  She has nocturia about once per average night.  She is not aware of any family history of sleep apnea. She drinks caffeine in the form of soda, usually 1 can per day.  She does not drink any alcohol, she is a non-smoker. Of note, her latest hemoglobin A1c last month was elevated at 7.8.  She is on 3 diabetes medications.  She is on 2 blood pressure medications.  She does not watch TV in her bedroom.  Her Past Medical History Is Significant  For: Past Medical History:  Diagnosis Date   Anemia 11/08/2023   Chicken pox    Diabetes mellitus type 2 in obese    History of jaundice    infant   HTN (hypertension), benign    Pure hypercholesterolemia 08/26/2020    Her Past Surgical History Is Significant For: Past Surgical History:  Procedure Laterality Date   NO PAST SURGERIES      Her Family History Is Significant For: Family History  Problem Relation Age of Onset   Diabetes Mother    Diabetes Father    Diabetes Sister    Early death Sister    Diabetes Maternal Grandmother    Hyperlipidemia Maternal Grandmother    Hyperlipidemia Maternal Grandfather    Diabetes Paternal Grandmother    Diabetes Paternal Grandfather    Sleep apnea Neg Hx     Her Social History Is Significant For: Social History   Socioeconomic History   Marital status: Single    Spouse name: Not on file   Number of children: Not on file   Years of education: Not on file   Highest education level: Master's degree (e.g., MA, MS, MEng, MEd, MSW, MBA)  Occupational History   Occupation: Production designer, theatre/television/film  Tobacco Use   Smoking status: Never   Smokeless tobacco: Never  Vaping Use   Vaping status: Never Used  Substance and Sexual Activity   Alcohol use: No  Alcohol/week: 0.0 standard drinks of alcohol   Drug use: No   Sexual activity: Yes    Partners: Male    Birth control/protection: Pill  Other Topics Concern   Not on file  Social History Narrative   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)   Has 2 sisters   Single, lives with her younger sister   No pets   Enjoys movies, taking a bath      Update 03/12/2024   Caffeine: 1 cup/day    Social Drivers of Corporate investment banker Strain: Low Risk  (02/08/2024)   Overall Financial Resource Strain (CARDIA)    Difficulty of Paying Living Expenses: Not hard at all  Food Insecurity: No Food Insecurity (02/08/2024)   Hunger Vital Sign    Worried About Running Out of Food in the Last Year:  Never true    Ran Out of Food in the Last Year: Never true  Transportation Needs: No Transportation Needs (02/08/2024)   PRAPARE - Administrator, Civil Service (Medical): No    Lack of Transportation (Non-Medical): No  Physical Activity: Insufficiently Active (02/08/2024)   Exercise Vital Sign    Days of Exercise per Week: 1 day    Minutes of Exercise per Session: 10 min  Stress: No Stress Concern Present (02/08/2024)   [employer] of Occupational Health - Occupational Stress Questionnaire    Feeling of Stress: Not at all  Social Connections: Moderately Integrated (02/08/2024)   Social Connection and Isolation Panel    Frequency of Communication with Friends and Family: More than three times a week    Frequency of Social Gatherings with Friends and Family: Three times a week    Attends Religious Services: More than 4 times per year    Active Member of Clubs or Organizations: Yes    Attends Banker Meetings: More than 4 times per year    Marital Status: Never married    Her Allergies Are:  Allergies  Allergen Reactions   Pravastatin  Itching and Rash  :   Her Current Medications Are:  Outpatient Encounter Medications as of 03/12/2024  Medication Sig   amLODipine  (NORVASC ) 10 MG tablet Take 1 tablet (10 mg total) by mouth every evening.   ezetimibe  (ZETIA ) 10 MG tablet Take 1 tablet (10 mg total) by mouth daily.   fenofibrate  160 MG tablet Take 1 tablet (160 mg total) by mouth daily.   fluticasone (FLONASE) 50 MCG/ACT nasal spray Place 1 spray into both nostrils daily.   glipiZIDE  (GLUCOTROL  XL) 10 MG 24 hr tablet TAKE 1 TABLET(10 MG) BY MOUTH DAILY WITH BREAKFAST   glucose blood (ONETOUCH VERIO) test strip USE TO CHECK BLOOD SUGARS ONCE DAILY   Insulin Pen Needle (PEN NEEDLES) 31G X 6 MM MISC 1 Application by Does not apply route daily at 12 noon.   liraglutide  (VICTOZA ) 18 MG/3ML SOPN Inject 1.8 mg into the skin daily.   lisinopril  (ZESTRIL ) 20 MG tablet  Take 1 tablet (20 mg total) by mouth daily.   metFORMIN  (GLUCOPHAGE ) 1000 MG tablet Take 1 tablet (1,000 mg total) by mouth 2 (two) times daily with a meal.   norgestimate -ethinyl estradiol  (ORTHO-CYCLEN) 0.25-35 MG-MCG tablet Take 1 tablet by mouth daily.   OneTouch Delica Lancets 33G MISC USE TO CHECK BLOOD SUGAR DAILY   PREVIDENT 5000 SENSITIVE 1.1-5 % GEL See admin instructions.   Probiotic Product (PROBIOTIC DAILY) CAPS Take 1 capsule by mouth daily.   No facility-administered encounter medications  on file as of 03/12/2024.  :   Review of Systems:  Out of a complete 14 point review of systems, all are reviewed and negative with the exception of these symptoms as listed below:   Review of Systems  Neurological:        Patient is here alone for sleep consult. Endorses snoring. No family history of sleep apnea. ESS 5 FSS 22    Objective:  Neurological Exam  Physical Exam Physical Examination:   Vitals:   03/12/24 1430  BP: (!) 148/98  Pulse: (!) 121  SpO2: 97%    General Examination: The patient is a very pleasant 38 y.o. female in no acute distress. She appears well-developed and well-nourished and well groomed.   HEENT: Normocephalic, atraumatic, pupils are equal, round and reactive to light, extraocular tracking is good without limitation to gaze excursion or nystagmus noted. Hearing is grossly intact. Face is symmetric with normal facial animation. Speech is clear with no dysarthria noted. There is no hypophonia. There is no lip, neck/head, jaw or voice tremor. Neck is supple with full range of passive and active motion. There are no carotid bruits on auscultation. Oropharynx exam reveals: mild mouth dryness, good dental hygiene with braces in place.  Moderate airway crowding secondary to small airway entry, tonsillar size of about 1-2+ on the right and 1+ on the left, Mallampati class II, neck circumference 15 inches.  Tongue protrudes centrally and palate elevates  symmetrically.   Chest: Clear to auscultation without wheezing, rhonchi or crackles noted.  Heart: S1+S2+0, regular and normal without murmurs, rubs or gallops noted.   Abdomen: Soft, non-tender and non-distended.  Extremities: There is no pitting edema in the distal lower extremities bilaterally.   Skin: Warm and dry without trophic changes noted.   Musculoskeletal: exam reveals no obvious joint deformities.   Neurologically:  Mental status: The patient is awake, alert and oriented in all 4 spheres. Her immediate and remote memory, attention, language skills and fund of knowledge are appropriate. There is no evidence of aphasia, agnosia, apraxia or anomia. Speech is clear with normal prosody and enunciation. Thought process is linear. Mood is normal and affect is normal.  Cranial nerves II - XII are as described above under HEENT exam.  Motor exam: Normal bulk, strength and tone is noted. There is no obvious action or resting tremor.  Fine motor skills and coordination: grossly intact.  Cerebellar testing: No dysmetria or intention tremor. There is no truncal or gait ataxia.  Sensory exam: intact to light touch in the upper and lower extremities.  Gait, station and balance: She stands easily. No veering to one side is noted. No leaning to one side is noted. Posture is age-appropriate and stance is narrow based. Gait shows normal stride length and normal pace. No problems turning are noted.   Assessment and plan:   In summary, Delainey Winstanley is a very pleasant 38 y.o.-year old female with an underlying medical history of anemia, diabetes, hypertension, hyperlipidemia, and obesity, whose history and physical exam are concerning for sleep disordered breathing, particularly obstructive sleep apnea (OSA). A laboratory attended sleep study is typically considered gold standard for evaluation of sleep disordered breathing.   I had a long chat with the patient about my findings and the  diagnosis of sleep apnea, particularly OSA, its prognosis and treatment options. We talked about medical/conservative treatments, surgical interventions and non-pharmacological approaches for symptom control. I explained, in particular, the risks and ramifications of untreated moderate to severe OSA,  especially with respect to developing cardiovascular disease down the road, including congestive heart failure (CHF), difficult to treat hypertension, cardiac arrhythmias (particularly A-fib), neurovascular complications including TIA, stroke and dementia. Even type 2 diabetes has, in part, been linked to untreated OSA. Symptoms of untreated OSA may include (but may not be limited to) daytime sleepiness, nocturia (i.e. frequent nighttime urination), memory problems, mood irritability and suboptimally controlled or worsening mood disorder such as depression and/or anxiety, lack of energy, lack of motivation, physical discomfort, as well as recurrent headaches, especially morning or nocturnal headaches. We talked about the importance of maintaining a healthy lifestyle and striving for healthy weight. In addition, we talked about the importance of striving for and maintaining good sleep hygiene. I recommended a sleep study at this time. I outlined the differences between a laboratory attended sleep study which is considered more comprehensive and accurate over the option of a home sleep test (HST); the latter may lead to underestimation of sleep disordered breathing in some instances and does not help with diagnosing upper airway resistance syndrome and is not accurate enough to diagnose primary central sleep apnea typically. I outlined possible surgical and non-surgical treatment options of OSA, including the use of a positive airway pressure (PAP) device (i.e. CPAP, AutoPAP/APAP or BiPAP in certain circumstances), a custom-made dental device (aka oral appliance, which would require a referral to a specialist dentist or  orthodontist typically, and is generally speaking not considered for patients with full dentures or edentulous state), upper airway surgical options, such as traditional UPPP (which is not considered a first-line treatment) or the Inspire device (hypoglossal nerve stimulator, which would involve a referral for consultation with an ENT surgeon, after careful selection, following inclusion criteria - also not first-line treatment). I explained the PAP treatment option to the patient in detail, as this is generally considered first-line treatment.  The patient indicated that she would be willing to try PAP therapy, if the need arises. I explained the importance of being compliant with PAP treatment, not only for insurance purposes but primarily to improve patient's symptoms symptoms, and for the patient's long term health benefit, including to reduce Her cardiovascular risks longer-term.    We will pick up our discussion about the next steps and treatment options after testing.  We will keep her posted as to the test results by phone call and/or MyChart messaging where possible.  We will plan to follow-up in sleep clinic accordingly as well.  I answered all her questions today and the patient was in agreement.   I encouraged her to call with any interim questions, concerns, problems or updates or email us  through MyChart.  Generally speaking, sleep test authorizations may take up to 2 weeks, sometimes less, sometimes longer, the patient is encouraged to get in touch with us  if they do not hear back from the sleep lab staff directly within the next 2 weeks.  Thank you very much for allowing me to participate in the care of this nice patient. If I can be of any further assistance to you please do not hesitate to call me at [phone].  Sincerely,   True Mar, MD, PhD

## 2024-03-12 NOTE — Patient Instructions (Signed)

## 2024-03-13 ENCOUNTER — Other Ambulatory Visit: Payer: Self-pay | Admitting: Nurse Practitioner

## 2024-03-13 DIAGNOSIS — E1169 Type 2 diabetes mellitus with other specified complication: Secondary | ICD-10-CM

## 2024-03-14 ENCOUNTER — Ambulatory Visit (INDEPENDENT_AMBULATORY_CARE_PROVIDER_SITE_OTHER): Payer: PRIVATE HEALTH INSURANCE | Admitting: Nurse Practitioner

## 2024-03-14 ENCOUNTER — Encounter: Payer: Self-pay | Admitting: Nurse Practitioner

## 2024-03-14 VITALS — BP 132/80 | HR 115 | Temp 97.7°F | Ht 65.0 in | Wt 205.0 lb

## 2024-03-14 DIAGNOSIS — I10 Essential (primary) hypertension: Secondary | ICD-10-CM

## 2024-03-14 NOTE — Progress Notes (Signed)
 Established Patient Visit  Patient: Laura Miller   DOB: [birth year]   38 y.o. Female  MRN: [id number] Visit Date: 03/14/2024  Subjective:    Chief Complaint  Patient presents with   Follow-up    Metformin  things are good, had consultation for sleep study on Wednesday concerned  about high heart rate   HPI HTN (hypertension), benign Improved BP control with amlodipine  and lisinopril  Persistent tachycardia-asymptomatic. BP Readings from Last 3 Encounters:  03/14/24 132/80  03/12/24 (!) 148/98  02/11/24 (!) 150/100    Wt Readings from Last 3 Encounters:  03/14/24 205 lb (93 kg)  03/12/24 207 lb (93.9 kg)  02/11/24 206 lb (93.4 kg)    Maintain med doses Encouraged to maintain daily exercise and DASH diet F/up in 3months  Reviewed medical, surgical, and social history today  Medications: Outpatient Medications Prior to Visit  Medication Sig   amLODipine  (NORVASC ) 10 MG tablet Take 1 tablet (10 mg total) by mouth every evening.   ezetimibe  (ZETIA ) 10 MG tablet Take 1 tablet (10 mg total) by mouth daily.   fenofibrate  160 MG tablet Take 1 tablet (160 mg total) by mouth daily.   fluticasone (FLONASE) 50 MCG/ACT nasal spray Place 1 spray into both nostrils daily.   glipiZIDE  (GLUCOTROL  XL) 10 MG 24 hr tablet TAKE 1 TABLET(10 MG) BY MOUTH DAILY WITH BREAKFAST   glucose blood (ONETOUCH VERIO) test strip USE TO CHECK BLOOD SUGARS ONCE DAILY   Insulin Pen Needle (PEN NEEDLES) 31G X 6 MM MISC 1 Application by Does not apply route daily at 12 noon.   liraglutide  (VICTOZA ) 18 MG/3ML SOPN Inject 1.8 mg into the skin daily.   lisinopril  (ZESTRIL ) 20 MG tablet Take 1 tablet (20 mg total) by mouth daily.   metFORMIN  (GLUCOPHAGE ) 1000 MG tablet Take 1 tablet (1,000 mg total) by mouth 2 (two) times daily with a meal.   norgestimate -ethinyl estradiol  (ORTHO-CYCLEN) 0.25-35 MG-MCG tablet Take 1 tablet by mouth daily.   OneTouch Delica Lancets 33G MISC USE TO CHECK BLOOD SUGAR  DAILY   PREVIDENT 5000 SENSITIVE 1.1-5 % GEL See admin instructions.   Probiotic Product (PROBIOTIC DAILY) CAPS Take 1 capsule by mouth daily.   No facility-administered medications prior to visit.   Reviewed past medical and social history.   ROS per HPI above      Objective:  BP 132/80   Pulse (!) 115   Temp 97.7 F (36.5 C) (Temporal)   Ht 5' 5 (1.651 m)   Wt 205 lb (93 kg)   LMP 03/01/2024 (Exact Date)   SpO2 96%   BMI 34.11 kg/m      Physical Exam Cardiovascular:     Pulses:          Dorsalis pedis pulses are 2+ on the right side and 2+ on the left side.       Posterior tibial pulses are 2+ on the right side and 2+ on the left side.  Musculoskeletal:     Right foot: Normal range of motion. No deformity, bunion, Charcot foot, foot drop or prominent metatarsal heads.     Left foot: Normal range of motion. No deformity, bunion, Charcot foot, foot drop or prominent metatarsal heads.  Feet:     Right foot:     Protective Sensation: 5 sites tested.  5 sites sensed.     Skin integrity: Skin integrity normal.     Toenail Condition: Right  toenails are normal.     Left foot:     Protective Sensation: 5 sites tested.  5 sites sensed.     Skin integrity: Skin integrity normal.     Toenail Condition: Left toenails are normal.     No results found for any visits on 03/14/24.    Assessment & Plan:    Problem List Items Addressed This Visit     HTN (hypertension), benign - Primary   Improved BP control with amlodipine  and lisinopril  Persistent tachycardia-asymptomatic. BP Readings from Last 3 Encounters:  03/14/24 132/80  03/12/24 (!) 148/98  02/11/24 (!) 150/100    Wt Readings from Last 3 Encounters:  03/14/24 205 lb (93 kg)  03/12/24 207 lb (93.9 kg)  02/11/24 206 lb (93.4 kg)    Maintain med doses Encouraged to maintain daily exercise and DASH diet F/up in 3months      Return in about 3 months (around 06/13/2024) for HTN, DM, hyperlipidemia  (fasting).     Roselie Mood, NP

## 2024-03-14 NOTE — Assessment & Plan Note (Addendum)
 Improved BP control with amlodipine  and lisinopril  Persistent tachycardia-asymptomatic. BP Readings from Last 3 Encounters:  03/14/24 132/80  03/12/24 (!) 148/98  02/11/24 (!) 150/100    Wt Readings from Last 3 Encounters:  03/14/24 205 lb (93 kg)  03/12/24 207 lb (93.9 kg)  02/11/24 206 lb (93.4 kg)    Maintain med doses Encouraged to maintain daily exercise and DASH diet F/up in 3months

## 2024-03-14 NOTE — Patient Instructions (Signed)
 Maintain Heart healthy diet and daily exercise. Maintain current medications. Monitor BP at home in AM 3x/week. Bring BP readings to next appointment.

## 2024-03-19 ENCOUNTER — Other Ambulatory Visit: Payer: PRIVATE HEALTH INSURANCE

## 2024-03-19 DIAGNOSIS — I10 Essential (primary) hypertension: Secondary | ICD-10-CM

## 2024-03-19 DIAGNOSIS — E1165 Type 2 diabetes mellitus with hyperglycemia: Secondary | ICD-10-CM

## 2024-03-19 DIAGNOSIS — E1169 Type 2 diabetes mellitus with other specified complication: Secondary | ICD-10-CM

## 2024-03-19 NOTE — Progress Notes (Signed)
     03/19/2024   Patient ID: Laura Miller, female   DOB: 1986-01-05, 38 y.o.   MRN: [id number]   Subjective/objective Telephone visit to follow-up on management of chronic disease states   Diabetes management plan -Current medications: Glipizide  XL 10 mg daily with breakfast, Victoza  1.8 mg daily, metformin  1000mg  twice daily after meals -Tolerating Victoza  well; other GLP1 medications too expensive on patient's insurance -Metformin  recently increased to 1000mg  BID, and patient states she is tolerating this dose increase well  -Patient does monitor BG regularly and states this has been in the low 200's throughout the day (after eating) -Does not endorse any signs or symptoms of hypoglycemia or hyperglycemia -A1c 9/12 7.8/%, up slightly from 7.7%  Hypertension -Current medications:  amlodipine  10mg  daily, lisinopril  20mg  -Lisinopril  increased from 10mg  and amlodipine  from 5mg  in August -Patient does have home BP monitor but does not monitor BP regularly -Last OV reading 132/80 on 9/12  Hyperlipidemia  -Current medications:  ezetimibe  10mg  daily, fenofibrate  160mg  daily -Pravastatin  caused itching and skin rash in the past -Patient is currently out of ezetimibe  10mg  and states refill is ready at [facility], but pharmacy is saying this will be $120 for a 3 month supply -Direct LDL 81 on 8/11  Assessment/plan   Diabetes management plan -Uncontrolled based on A1c >7% -Continue current regimen and regular monitoring and recording of blood glucose -Continue lifestyle modifications including dietary changes and increased exercise -If readings remain elevated, could consider addition of basal insulin at bedtime or pioglitazone  Hypertension -Improved control -Continue current regimen at this time -Begin checking home BP at least 2-3x/week   Hyperlipidemia -Continue current regimen at this time -Continue lifestyle modifications including dietary changes and increased  exercise -Contacted [facility] to make sure insurance is being billed for ezetimibe  10mg , and it is.  The pharmacy was able to process this under a discount card instead to make the copay $23.14 for a 90 day supply. -Could consider initiation of fish oil in the future if needed for additional LDL and TG control   Follow-up: 11/3   [name], PharmD, DPLA

## 2024-03-24 ENCOUNTER — Telehealth: Payer: Self-pay | Admitting: Neurology

## 2024-03-24 NOTE — Telephone Encounter (Signed)
 I spoke with the patient.  [employer] no auth req ref [employer] on 03/19/24.  Patient is scheduled at [facility] for 04/21/24 at 9 pm.  Mailed packet and sent mychart.

## 2024-04-18 ENCOUNTER — Ambulatory Visit (INDEPENDENT_AMBULATORY_CARE_PROVIDER_SITE_OTHER): Payer: PRIVATE HEALTH INSURANCE

## 2024-04-18 DIAGNOSIS — Z01419 Encounter for gynecological examination (general) (routine) without abnormal findings: Secondary | ICD-10-CM

## 2024-04-18 NOTE — Progress Notes (Unsigned)
 Patient presents for annual exam, but previous exam in December 2024.  Patient will need to return after December 18th.Patient to be rescheduled.  Harlene LITTIE Duncans MSN, CNM Advanced Practice Provider, [facility] [employer]

## 2024-04-21 ENCOUNTER — Ambulatory Visit (INDEPENDENT_AMBULATORY_CARE_PROVIDER_SITE_OTHER): Payer: PRIVATE HEALTH INSURANCE | Admitting: Neurology

## 2024-04-21 DIAGNOSIS — R519 Headache, unspecified: Secondary | ICD-10-CM

## 2024-04-21 DIAGNOSIS — G4719 Other hypersomnia: Secondary | ICD-10-CM

## 2024-04-21 DIAGNOSIS — Z9189 Other specified personal risk factors, not elsewhere classified: Secondary | ICD-10-CM

## 2024-04-21 DIAGNOSIS — R0683 Snoring: Secondary | ICD-10-CM

## 2024-04-21 DIAGNOSIS — R351 Nocturia: Secondary | ICD-10-CM

## 2024-04-21 DIAGNOSIS — G4733 Obstructive sleep apnea (adult) (pediatric): Secondary | ICD-10-CM

## 2024-04-21 DIAGNOSIS — G4720 Circadian rhythm sleep disorder, unspecified type: Secondary | ICD-10-CM

## 2024-04-21 DIAGNOSIS — E66811 Obesity, class 1: Secondary | ICD-10-CM

## 2024-04-22 NOTE — Procedures (Signed)
 Physician Interpretation: Please see link under Procedure Tab or under Encounters tab for physician report, technical report, as well as O2 titration and/or PAP titration tables (if applicable).

## 2024-04-28 ENCOUNTER — Ambulatory Visit: Payer: Self-pay | Admitting: Neurology

## 2024-04-28 DIAGNOSIS — G4733 Obstructive sleep apnea (adult) (pediatric): Secondary | ICD-10-CM

## 2024-05-01 NOTE — Telephone Encounter (Signed)
-----   Message from True Mar sent at 04/28/2024  7:10 PM EDT ----- Patient referred by PCP, seen by me on 03/12/24, diagnostic PSG on 04/21/24.    Please call and notify the patient that the recent sleep study did confirm the diagnosis of obstructive sleep apnea. OSA is overall mild (or one could say, even borderline), but worth treating to see  if she feels better after treatment (or if her BP improves with treatment). To that end, I recommend treatment for this in the form of autoPAP, which means, that we don't have to bring her back for a  second sleep study with CPAP, but will let him try an autoPAP machine at home, through a DME company (of her choice, or as per insurance requirement). The DME representative will educate her on how  to use the machine, how to put the mask on, etc. I have placed an order in the chart. Please send referral, talk to patient, send report to referring MD. We will need a FU in sleep clinic for 10  weeks post-PAP set up, please arrange that with me or one of our NPs. Thanks,   True Mar, MD, PhD [facility])   ----- Message ----- From: Rebecka Fleeta Higashi In One Three One Sent: 04/28/2024   7:07 PM EDT To: True Mar, MD

## 2024-05-01 NOTE — Telephone Encounter (Signed)
 Spoke with patient and discussed her sleep study results. Pt is in agreement to start autopap at home. Discussed insurance compliance requirements which includes using the machine at least 4 hours at night and being seen for a follow-up appt at [facility] between 30 and 90 days after setup. Pt did not have preference for DME. Will refer to [facility]. Pt was advised to notify us  if she has not heard from them within 1 week. Pt's questions were answered. Pt verbalized appreciation for the call.  Referral sent to [facility]. Sleep study result sent to referring provider.

## 2024-05-03 NOTE — Progress Notes (Unsigned)
     03/19/2024   Patient ID: Laura Miller, female   DOB: 1986-01-05, 38 y.o.   MRN: [id number]   Subjective/objective Telephone visit to follow-up on management of chronic disease states   Diabetes management plan -Current medications: Glipizide  XL 10 mg daily with breakfast, Victoza  1.8 mg daily, metformin  1000mg  twice daily after meals -Tolerating Victoza  well; other GLP1 medications too expensive on patient's insurance -Metformin  recently increased to 1000mg  BID, and patient states she is tolerating this dose increase well  -Patient does monitor BG regularly and states this has been in the low 200's throughout the day (after eating) -Does not endorse any signs or symptoms of hypoglycemia or hyperglycemia -A1c 9/12 7.8/%, up slightly from 7.7%  Hypertension -Current medications:  amlodipine  10mg  daily, lisinopril  20mg  -Lisinopril  increased from 10mg  and amlodipine  from 5mg  in August -Patient does have home BP monitor but does not monitor BP regularly -Last OV reading 132/80 on 9/12  Hyperlipidemia  -Current medications:  ezetimibe  10mg  daily, fenofibrate  160mg  daily -Pravastatin  caused itching and skin rash in the past -Patient is currently out of ezetimibe  10mg  and states refill is ready at [facility], but pharmacy is saying this will be $120 for a 3 month supply -Direct LDL 81 on 8/11  Assessment/plan   Diabetes management plan -Uncontrolled based on A1c >7% -Continue current regimen and regular monitoring and recording of blood glucose -Continue lifestyle modifications including dietary changes and increased exercise -If readings remain elevated, could consider addition of basal insulin at bedtime or pioglitazone  Hypertension -Improved control -Continue current regimen at this time -Begin checking home BP at least 2-3x/week   Hyperlipidemia -Continue current regimen at this time -Continue lifestyle modifications including dietary changes and increased  exercise -Contacted [facility] to make sure insurance is being billed for ezetimibe  10mg , and it is.  The pharmacy was able to process this under a discount card instead to make the copay $23.14 for a 90 day supply. -Could consider initiation of fish oil in the future if needed for additional LDL and TG control   Follow-up: 11/3   Laura Miller, PharmD, DPLA

## 2024-05-05 ENCOUNTER — Other Ambulatory Visit: Payer: PRIVATE HEALTH INSURANCE

## 2024-05-05 DIAGNOSIS — I10 Essential (primary) hypertension: Secondary | ICD-10-CM

## 2024-05-05 DIAGNOSIS — E1169 Type 2 diabetes mellitus with other specified complication: Secondary | ICD-10-CM

## 2024-05-05 DIAGNOSIS — E1165 Type 2 diabetes mellitus with hyperglycemia: Secondary | ICD-10-CM

## 2024-05-28 ENCOUNTER — Ambulatory Visit: Payer: PRIVATE HEALTH INSURANCE | Admitting: Family Medicine

## 2024-06-06 ENCOUNTER — Other Ambulatory Visit: Payer: Self-pay | Admitting: Nurse Practitioner

## 2024-06-06 DIAGNOSIS — E1165 Type 2 diabetes mellitus with hyperglycemia: Secondary | ICD-10-CM

## 2024-06-13 ENCOUNTER — Encounter: Payer: Self-pay | Admitting: Nurse Practitioner

## 2024-06-13 ENCOUNTER — Ambulatory Visit: Payer: PRIVATE HEALTH INSURANCE | Admitting: Nurse Practitioner

## 2024-06-13 ENCOUNTER — Ambulatory Visit: Payer: Self-pay | Admitting: Nurse Practitioner

## 2024-06-13 VITALS — BP 128/80 | HR 108 | Temp 98.7°F | Ht 65.0 in | Wt 207.8 lb

## 2024-06-13 DIAGNOSIS — I10 Essential (primary) hypertension: Secondary | ICD-10-CM

## 2024-06-13 DIAGNOSIS — E1169 Type 2 diabetes mellitus with other specified complication: Secondary | ICD-10-CM

## 2024-06-13 LAB — LIPID PANEL
Cholesterol: 145 mg/dL (ref 0–200)
HDL: 49.4 mg/dL (ref >39.00)
LDL Cholesterol: 63 mg/dL (ref 0–99)
NonHDL: 95.74
Total CHOL/HDL Ratio: 3
Triglycerides: 163 mg/dL — ABNORMAL HIGH (ref 0.0–149.0)
VLDL: 32.6 mg/dL (ref 0.0–40.0)

## 2024-06-13 LAB — RENAL FUNCTION PANEL
Albumin: 4.1 g/dL (ref 3.5–5.2)
BUN: 15 mg/dL (ref 6–23)
CO2: 25 meq/L (ref 19–32)
Calcium: 10 mg/dL (ref 8.4–10.5)
Chloride: 101 meq/L (ref 96–112)
Creatinine, Ser: 1.07 mg/dL (ref 0.40–1.20)
GFR: 66.15 mL/min (ref >60.00)
Glucose, Bld: 217 mg/dL — ABNORMAL HIGH (ref 70–99)
Phosphorus: 4.6 mg/dL (ref 2.3–4.6)
Potassium: 4.2 meq/L (ref 3.5–5.1)
Sodium: 138 meq/L (ref 135–145)

## 2024-06-13 LAB — POCT GLYCOSYLATED HEMOGLOBIN (HGB A1C): Hemoglobin A1C: 8 % — AB (ref 4.0–5.6)

## 2024-06-13 MED ORDER — GLIPIZIDE ER 10 MG PO TB24: 20.0000 mg | ORAL_TABLET | Freq: Every day | ORAL | 3 refills | Status: AC

## 2024-06-13 NOTE — Progress Notes (Signed)
 Established Patient Visit  Patient: Laura Miller   DOB: June 10, 1986   38 y.o. Female  MRN: [id number] Visit Date: 06/13/2024  Subjective:    Chief Complaint  Patient presents with   Follow-up    3 months no concerns, pt is fasting   HPI Diabetes mellitus (HCC)  >>ASSESSMENT AND PLAN FOR TYPE 2 DIABETES MELLITUS WITH HYPERGLYCEMIA, WITHOUT LONG-TERM CURRENT USE OF INSULIN (HCC) WRITTEN ON 06/13/2024 12:40 PM BY Munachimso Palin LUM, NP  Fasting glucose 140s Reports compliance with metformin  1000mg  BID, glipizide  10mg  every day, and victoza  1.8mg  daily. Denies any adverse effects. Admits to non compliance with diet and lack of exercise. Repeat hgbA1c at 8% We discussed use of basal inulin and importance of diet modification. She declined use of insulin. Increase glipizide  dose to 20mg  every day. Maintain other med doses. F/up in 3months  HTN (hypertension), benign BP at goal Persistent tachycardia-asymptomatic. BP Readings from Last 3 Encounters:  06/13/24 128/80  03/14/24 132/80  03/12/24 (!) 148/98     Maintain med doses Encouraged to maintain daily exercise and DASH diet F/up in 3months  Hyperlipidemia associated with type 2 diabetes mellitus (HCC) Repeat lipid panel Maintain zetia  and fenofibrate  dose Hme glucose:  Wt Readings from Last 3 Encounters:  06/13/24 207 lb 12.8 oz (94.3 kg)  03/14/24 205 lb (93 kg)  03/12/24 207 lb (93.9 kg)    Reviewed medical, surgical, and social history today  Medications: Show/hide medication list[1] Reviewed past medical and social history.   ROS per HPI above      Objective:  BP 128/80   Pulse (!) 108   Temp 98.7 F (37.1 C) (Temporal)   Ht 5' 5 (1.651 m)   Wt 207 lb 12.8 oz (94.3 kg)   LMP 05/23/2024 (Exact Date)   SpO2 97%   BMI 34.58 kg/m      Physical Exam Vitals and nursing note reviewed.  Cardiovascular:     Rate and Rhythm: Normal rate.     Pulses: Normal pulses.  Pulmonary:      Effort: Pulmonary effort is normal.  Musculoskeletal:     Right lower leg: No edema.     Left lower leg: No edema.  Neurological:     Mental Status: She is alert and oriented to person, place, and time.     Results for orders placed or performed in visit on 06/13/24  POCT glycosylated hemoglobin (Hb A1C)  Result Value Ref Range   Hemoglobin A1C 8.0 (A) 4.0 - 5.6 %   HbA1c POC (<> result, manual entry)     HbA1c, POC (prediabetic range)     HbA1c, POC (controlled diabetic range)        Assessment & Plan:    Problem List Items Addressed This Visit     Diabetes mellitus (HCC)    >>ASSESSMENT AND PLAN FOR TYPE 2 DIABETES MELLITUS WITH HYPERGLYCEMIA, WITHOUT LONG-TERM CURRENT USE OF INSULIN (HCC) WRITTEN ON 06/13/2024 12:40 PM BY Chamar Broughton LUM, NP  Fasting glucose 140s Reports compliance with metformin  1000mg  BID, glipizide  10mg  every day, and victoza  1.8mg  daily. Denies any adverse effects. Admits to non compliance with diet and lack of exercise. Repeat hgbA1c at 8% We discussed use of basal inulin and importance of diet modification. She declined use of insulin. Increase glipizide  dose to 20mg  every day. Maintain other med doses. F/up in 3months      Relevant Medications  glipiZIDE  (GLUCOTROL  XL) 10 MG 24 hr tablet   Other Relevant Orders   POCT glycosylated hemoglobin (Hb A1C) (Completed)   HTN (hypertension), benign - Primary   BP at goal Persistent tachycardia-asymptomatic. BP Readings from Last 3 Encounters:  06/13/24 128/80  03/14/24 132/80  03/12/24 (!) 148/98     Maintain med doses Encouraged to maintain daily exercise and DASH diet F/up in 3months      Relevant Orders   Renal Function Panel   Hyperlipidemia associated with type 2 diabetes mellitus (HCC)   Repeat lipid panel Maintain zetia  and fenofibrate  dose      Relevant Medications   glipiZIDE  (GLUCOTROL  XL) 10 MG 24 hr tablet   Other Relevant Orders   Lipid panel   Return in about 3  months (around 09/11/2024) for DM, HTN.     Roselie Mood, NP      [1]  Outpatient Medications Prior to Visit  Medication Sig   amLODipine  (NORVASC ) 10 MG tablet Take 1 tablet (10 mg total) by mouth every evening.   ezetimibe  (ZETIA ) 10 MG tablet TAKE 1 TABLET(10 MG) BY MOUTH DAILY   fenofibrate  160 MG tablet Take 1 tablet (160 mg total) by mouth daily.   fluticasone (FLONASE) 50 MCG/ACT nasal spray Place 1 spray into both nostrils daily.   glucose blood (ONETOUCH VERIO) test strip USE TO CHECK BLOOD SUGARS ONCE DAILY   Insulin Pen Needle (EMBECTA PEN NEEDLE ULTRAFINE) 32G X 6 MM MISC USE DAILY AT NOON   liraglutide  (VICTOZA ) 18 MG/3ML SOPN ADMINISTER 1.8 MG UNDER THE SKIN DAILY   lisinopril  (ZESTRIL ) 20 MG tablet Take 1 tablet (20 mg total) by mouth daily.   metFORMIN  (GLUCOPHAGE ) 1000 MG tablet Take 1 tablet (1,000 mg total) by mouth 2 (two) times daily with a meal.   norgestimate -ethinyl estradiol  (ORTHO-CYCLEN) 0.25-35 MG-MCG tablet Take 1 tablet by mouth daily.   OneTouch Delica Lancets 33G MISC USE TO CHECK BLOOD SUGAR DAILY   PREVIDENT 5000 SENSITIVE 1.1-5 % GEL See admin instructions.   Probiotic Product (PROBIOTIC DAILY) CAPS Take 1 capsule by mouth daily.   [DISCONTINUED] glipiZIDE  (GLUCOTROL  XL) 10 MG 24 hr tablet TAKE 1 TABLET(10 MG) BY MOUTH DAILY WITH BREAKFAST   No facility-administered medications prior to visit.

## 2024-06-13 NOTE — Assessment & Plan Note (Signed)
 Repeat lipid panel Maintain zetia  and fenofibrate  dose

## 2024-06-13 NOTE — Assessment & Plan Note (Signed)
 BP at goal Persistent tachycardia-asymptomatic. BP Readings from Last 3 Encounters:  06/13/24 128/80  03/14/24 132/80  03/12/24 (!) 148/98     Maintain med doses Encouraged to maintain daily exercise and DASH diet F/up in 3months

## 2024-06-13 NOTE — Assessment & Plan Note (Signed)
>>  ASSESSMENT AND PLAN FOR TYPE 2 DIABETES MELLITUS WITH HYPERGLYCEMIA, WITHOUT LONG-TERM CURRENT USE OF INSULIN (HCC) WRITTEN ON 06/13/2024 12:40 PM BY Abeer Deskins LUM, NP  Fasting glucose 140s Reports compliance with metformin  1000mg  BID, glipizide  10mg  every day, and victoza  1.8mg  daily. Denies any adverse effects. Admits to non compliance with diet and lack of exercise. Repeat hgbA1c at 8% We discussed use of basal inulin and importance of diet modification. She declined use of insulin. Increase glipizide  dose to 20mg  every day. Maintain other med doses. F/up in 3months

## 2024-06-13 NOTE — Assessment & Plan Note (Signed)
 Fasting glucose 140s Reports compliance with metformin  1000mg  BID, glipizide  10mg  every day, and victoza  1.8mg  daily. Denies any adverse effects. Admits to non compliance with diet and lack of exercise. Repeat hgbA1c at 8% We discussed use of basal inulin and importance of diet modification. She declined use of insulin. Increase glipizide  dose to 20mg  every day. Maintain other med doses. F/up in 3months

## 2024-06-13 NOTE — Patient Instructions (Signed)
 Go to lab Maintain Heart healthy diet and daily exercise. Monitor glucose daily in AM Bring glucose readings to next appointment. Increase glipizide  dose to 20mg  daily. Maintain other medications doses

## 2024-06-18 NOTE — Telephone Encounter (Signed)
 Called and left patient a voicemail to call back and reschedule the 12/26 appointment.

## 2024-06-27 ENCOUNTER — Ambulatory Visit: Payer: PRIVATE HEALTH INSURANCE | Admitting: Obstetrics and Gynecology

## 2024-07-30 ENCOUNTER — Ambulatory Visit: Payer: PRIVATE HEALTH INSURANCE | Admitting: Obstetrics and Gynecology

## 2024-07-30 VITALS — BP 143/81 | HR 108 | Ht 65.0 in | Wt 209.1 lb

## 2024-07-30 DIAGNOSIS — I10 Essential (primary) hypertension: Secondary | ICD-10-CM

## 2024-07-30 DIAGNOSIS — Z01419 Encounter for gynecological examination (general) (routine) without abnormal findings: Secondary | ICD-10-CM | POA: Diagnosis not present

## 2024-07-30 DIAGNOSIS — Z3009 Encounter for other general counseling and advice on contraception: Secondary | ICD-10-CM

## 2024-07-30 MED ORDER — NORETHINDRONE 0.35 MG PO TABS: 1.0000 | ORAL_TABLET | Freq: Every day | ORAL | 3 refills | Status: AC

## 2024-07-30 NOTE — Progress Notes (Signed)
 Patient presents for Annual.  LMP: 07/18/2024 Last pap: Date: 01/26/2021 Contraception: POP Mammogram: Not yet indicated STD Screening: Declines Flu Vaccine : Declines  CC:   Fun Fact:

## 2024-07-30 NOTE — Progress Notes (Signed)
 "  ANNUAL GYNECOLOGY VISIT Chief Complaint  Patient presents with   Gynecologic Exam    Annual without STD testing      Subjective:  Laura Miller is a 39 y.o. G0P0000 who presents for annual exam.  Doing well, no concerns. Hx HTN, takes lisinopril  and amlodipine  BP elevated today On OCPs   Gyn History: Patient's last menstrual period was 07/18/2024 (exact date). Sexually active: yes/no: Yes Contraception: oral contraceptives (estrogen/progesterone) History of STIs: No Last pap:  Lab Results  Component Value Date   DIAGPAP  01/26/2021    - Negative for intraepithelial lesion or malignancy (NILM)   HPV NOT DETECTED 12/09/2018   HPVHIGH Negative 01/26/2021   History of abnormal pap: No Periods: regular    The pregnancy intention screening data noted above was reviewed. Potential methods of contraception were discussed. The patient elected to proceed with No data recorded.       07/30/2024    1:37 PM 10/04/2023   10:02 AM 06/20/2023    1:17 PM 04/25/2023    8:07 AM 01/17/2023    8:05 AM  Depression screen PHQ 2/9  Decreased Interest 0 0 0 0 0  Down, Depressed, Hopeless 0 0 0 0 0  PHQ - 2 Score 0 0 0 0 0  Altered sleeping 0 0 0    Tired, decreased energy 0 0 0    Change in appetite 0 0 0    Feeling bad or failure about yourself  0 0 0    Trouble concentrating 0 0 0    Moving slowly or fidgety/restless 0 0 0    Suicidal thoughts 0 0 0    PHQ-9 Score 0 0  0     Difficult doing work/chores  Not difficult at all        Data saved with a previous flowsheet row definition        07/30/2024    1:37 PM 10/04/2023   10:02 AM 06/20/2023    1:18 PM 04/12/2022    1:33 PM  GAD 7 : Generalized Anxiety Score  Nervous, Anxious, on Edge 0 0  0  0   Control/stop worrying 0 0  0  0   Worry too much - different things 0 0  0  0   Trouble relaxing 0 0  0  0   Restless 0 0  0  0   Easily annoyed or irritable 0 0  0  0   Afraid - awful might happen 0 0  0  0   Total GAD 7  Score 0 0 0 0  Anxiety Difficulty  Not difficult at all       Data saved with a previous flowsheet row definition      OB History     Gravida  0   Para  0   Term  0   Preterm  0   AB  0   Living  0      SAB  0   IAB  0   Ectopic  0   Multiple  0   Live Births  0           Past Medical History:  Diagnosis Date   Anemia 11/08/2023   Chicken pox    Diabetes mellitus type 2 in obese    History of jaundice    infant   HTN (hypertension), benign    Pure hypercholesterolemia 08/26/2020    Past Surgical History:  Procedure Laterality Date  NO PAST SURGERIES      Social History   Socioeconomic History   Marital status: Single    Spouse name: Not on file   Number of children: Not on file   Years of education: Not on file   Highest education level: Master's degree (e.g., MA, MS, MEng, MEd, MSW, MBA)  Occupational History   Occupation: Production Designer, Theatre/television/film  Tobacco Use   Smoking status: Never   Smokeless tobacco: Never  Vaping Use   Vaping status: Never Used  Substance and Sexual Activity   Alcohol use: No    Alcohol/week: 0.0 standard drinks of alcohol   Drug use: No   Sexual activity: Yes    Partners: Male    Birth control/protection: Pill  Other Topics Concern   Not on file  Social History Narrative   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)   Has 2 sisters   Single, lives with her younger sister   No pets   Enjoys movies, taking a bath      Update 03/12/2024   Caffeine: 1 cup/day    Social Drivers of Health   Tobacco Use: Low Risk (06/13/2024)   Patient History    Smoking Tobacco Use: Never    Smokeless Tobacco Use: Never    Passive Exposure: Not on file  Financial Resource Strain: Low Risk (06/09/2024)   Overall Financial Resource Strain (CARDIA)    Difficulty of Paying Living Expenses: Not hard at all  Food Insecurity: No Food Insecurity (06/09/2024)   Epic    Worried About Radiation Protection Practitioner of Food in the Last Year: Never true    Ran  Out of Food in the Last Year: Never true  Transportation Needs: No Transportation Needs (06/09/2024)   Epic    Lack of Transportation (Medical): No    Lack of Transportation (Non-Medical): No  Physical Activity: Insufficiently Active (06/09/2024)   Exercise Vital Sign    Days of Exercise per Week: 1 day    Minutes of Exercise per Session: 20 min  Stress: No Stress Concern Present (06/09/2024)   [employer] of Occupational Health - Occupational Stress Questionnaire    Feeling of Stress: Not at all  Social Connections: Moderately Integrated (06/09/2024)   Social Connection and Isolation Panel    Frequency of Communication with Friends and Family: More than three times a week    Frequency of Social Gatherings with Friends and Family: More than three times a week    Attends Religious Services: More than 4 times per year    Active Member of Clubs or Organizations: Yes    Attends Banker Meetings: More than 4 times per year    Marital Status: Never married  Depression (PHQ2-9): Low Risk (07/30/2024)   Depression (PHQ2-9)    PHQ-2 Score: 0  Alcohol Screen: Low Risk (09/30/2023)   Alcohol Screen    Last Alcohol Screening Score (AUDIT): 1  Housing: Low Risk (06/09/2024)   Epic    Unable to Pay for Housing in the Last Year: No    Number of Times Moved in the Last Year: 0    Homeless in the Last Year: No  Utilities: Not on file  Health Literacy: Not on file    Family History  Problem Relation Age of Onset   Diabetes Mother    Diabetes Father    Diabetes Sister    Early death Sister    Diabetes Maternal Grandmother    Hyperlipidemia Maternal Grandmother    Hyperlipidemia Maternal Grandfather  Diabetes Paternal Grandmother    Diabetes Paternal Grandfather    Sleep apnea Neg Hx     Medications Ordered Prior to Encounter[1]  Allergies[2]   Objective:   Vitals:   07/30/24 1333 07/30/24 1338  BP: (!) 138/100 (!) 143/81  Pulse: (!) 117 (!) 108  Weight: 209 lb  1.9 oz (94.9 kg)   Height: 5' 5 (1.651 m)    Physical Examination:   General appearance - well appearing, and in no distress  Mental status - alert, oriented to person, place, and time  Psych:  normal mood and affect  Skin - warm and dry, normal color, no suspicious lesions noted  Chest - effort normal, all lung fields clear to auscultation bilaterally  Heart - normal rate and regular rhythm  Neck:  midline trachea, no thyromegaly or nodules  Breasts - breasts appear normal, no suspicious masses, no skin or nipple changes or  axillary nodes  Abdomen - soft, nontender, nondistended, no masses or organomegaly  Pelvic -  VULVA: normal appearing vulva with no masses, tenderness or lesions   VAGINA: normal appearing vagina with normal color and discharge, no lesions   CERVIX: normal appearing cervix without discharge or lesions   UTERUS: uterus is felt to be normal size, shape, consistency and nontender   ADNEXA: No adnexal masses or tenderness noted.  Extremities:  No swelling or varicosities noted  Chaperone present for exam  Assessment and Plan:  1. Encounter for well woman exam with routine gynecological exam (Primary) Pap due 2027 Normal clinical breast exam, reviewed self breast exams, mammograms to start at age 6 Declines STI screening Switch to POPs  2. HTN (hypertension), benign Recommend home BP monitoring and PCP f/u Do not recommend combined hormonal contraception. Reviewed progesterone-only pills, depo, nexplanon, IUD, etc. Patient chooses POPs for now, considering IUD, has used before  3. Contraceptive education See above - norethindrone  (MICRONOR ) 0.35 MG tablet; Take 1 tablet (0.35 mg total) by mouth daily.  Dispense: 84 tablet; Refill: 3   Future Appointments  Date Time Provider Department Center  08/05/2024  8:30 AM Deanna Channing LABOR, [id number] None  09/17/2024  8:20 AM Nche, Roselie Rockford, NP [facility]    Rollo ONEIDA Bring, MD, FACOG Obstetrician  & Gynecologist, [facility], [facility]     [1]  Current Outpatient Medications on File Prior to Visit  Medication Sig Dispense Refill   amLODipine  (NORVASC ) 10 MG tablet Take 1 tablet (10 mg total) by mouth every evening. 90 tablet 1   ezetimibe  (ZETIA ) 10 MG tablet TAKE 1 TABLET(10 MG) BY MOUTH DAILY 90 tablet 1   fenofibrate  160 MG tablet Take 1 tablet (160 mg total) by mouth daily. 90 tablet 1   fluticasone (FLONASE) 50 MCG/ACT nasal spray Place 1 spray into both nostrils daily.     glipiZIDE  (GLUCOTROL  XL) 10 MG 24 hr tablet Take 2 tablets (20 mg total) by mouth daily with breakfast. 180 tablet 3   glucose blood (ONETOUCH VERIO) test strip USE TO CHECK BLOOD SUGARS ONCE DAILY 100 strip 1   Insulin Pen Needle (EMBECTA PEN NEEDLE ULTRAFINE) 32G X 6 MM MISC USE DAILY AT NOON 100 each 1   liraglutide  (VICTOZA ) 18 MG/3ML SOPN ADMINISTER 1.8 MG UNDER THE SKIN DAILY 9 mL 2   lisinopril  (ZESTRIL ) 20 MG tablet Take 1 tablet (20 mg total) by mouth daily. 90 tablet 1   metFORMIN  (GLUCOPHAGE ) 1000 MG tablet Take 1 tablet (1,000  mg total) by mouth 2 (two) times daily with a meal. 180 tablet 3   OneTouch Delica Lancets 33G MISC USE TO CHECK BLOOD SUGAR DAILY 100 each 3   PREVIDENT 5000 SENSITIVE 1.1-5 % GEL See admin instructions.     Probiotic Product (PROBIOTIC DAILY) CAPS Take 1 capsule by mouth daily.     No current facility-administered medications on file prior to visit.  [2]  Allergies Allergen Reactions   Pravastatin  Itching and Rash   "

## 2024-08-04 NOTE — Progress Notes (Unsigned)
 "    08/05/24   Patient ID: Laura Miller, female   DOB: 1985/11/08, 39 y.o.   MRN: [id number]   Subjective/objective Telephone visit to follow-up on management of chronic disease states   Diabetes management plan -Current medications: Glipizide  XL 20 mg daily with breakfast, Victoza  1.8 mg daily, metformin  1000mg  twice daily after meals -Tolerating Victoza  well; other GLP1 medications too expensive on patient's insurance -Patient does monitor BG regularly and states this has been 127-136 in the morning before eating or taking morning medications -Does not endorse any signs or symptoms of hypoglycemia or hyperglycemia -A1c increased at last PCP visit; but patient declined basal insulin, so glipizide  was increased from 10mg  to 20mg  daily -Patient did endorse non adherence to lifestyle modifications to help with diabetes control -ACEi/ARB for cardiorenal protection:  lisinopril  20mg  daily -UACR 12.8 in April 2025 -Statin for ASCVD risk reduction:  no, pravastatin  caused rash in the past  Lab Results  Component Value Date   HGBA1C 8.0 (A) 06/13/2024   HGBA1C 7.8 (H) 02/11/2024   HGBA1C 7.7 (H) 11/08/2023   Hypertension -Current medications:  amlodipine  10mg  daily, lisinopril  20mg  -Patient does have home BP monitor and has been monitoring; endorses recent readings in the 130's/80's -Last OV reading 143/81 on 1/28 at [facility]- provider changed oral contraceptive to progestin only medication to see if removing estrogen component would help with BP control- patient has not yet received this medication and continues taking norgestimate -ethinyl estradiol      Component Value Date/Time   NA 138 06/13/2024 0944   K 4.2 06/13/2024 0944   CL 101 06/13/2024 0944   CO2 25 06/13/2024 0944   GLUCOSE 217 (H) 06/13/2024 0944   BUN 15 06/13/2024 0944   CREATININE 1.07 06/13/2024 0944   CREATININE 0.83 04/12/2022 1328   CALCIUM 10.0 06/13/2024 0944   PROT 7.5 02/11/2024 1003   ALBUMIN 4.1 06/13/2024  0944   AST 16 02/11/2024 1003   ALT 16 02/11/2024 1003   ALKPHOS 43 02/11/2024 1003   BILITOT 0.2 02/11/2024 1003   GFR 66.15 06/13/2024 0944   EGFR 94 04/12/2022 1328    Hyperlipidemia  -Current medications:  ezetimibe  10mg  daily, fenofibrate  160mg  daily -Pravastatin  caused itching and skin rash in the past     Component Value Date/Time   CHOL 145 06/13/2024 0944   TRIG 163.0 (H) 06/13/2024 0944   HDL 49.40 06/13/2024 0944   CHOLHDL 3 06/13/2024 0944   VLDL 32.6 06/13/2024 0944   LDLCALC 63 06/13/2024 0944   LDLDIRECT 81.0 02/11/2024 1003    Assessment/plan   Diabetes management plan -Uncontrolled based on A1c >7% -UACR at goal -BP not at goal of <130/80 -LDL at goal of <70 -Continue current regimen at this time- I am going to reach out to the PA team to run test claims for weekly GLP1 medications to see if any better insurance coverage for one of these options this year -Continue regular monitoring and recording of blood glucose -Resume lifestyle modifications including dietary changes and increased exercise -PCP again 3/18 -patient will be due for A1c and UACR  Hypertension -Moderately controlled based on home BP readings <130/180 -Continue current regimen at this time -Continue checking home BP at least 2-3x/week  -Coordinating with patient's pharmacy to see if norethindrone  0.35mg  can be filled, so patient can go ahead and switch to this medication to see if we get improvement in BP without estrogen on board  Hyperlipidemia -LDL at goal of <70 based on most recent lipid panel -  Continue current regimen at this time --Resume lifestyle modifications including dietary changes and increased exercise -Could consider initiation of fish oil in the future if needed for additional LDL and TG control   Follow-up: Will follow-up with patient in regard to affordability of other GLP1 medications and fill status of new oral contraceptive.  Will schedule telephone follow-up at that  time, also.   [name], PharmD, DPLA      "

## 2024-08-05 ENCOUNTER — Other Ambulatory Visit: Payer: PRIVATE HEALTH INSURANCE

## 2024-08-05 DIAGNOSIS — E1169 Type 2 diabetes mellitus with other specified complication: Secondary | ICD-10-CM

## 2024-08-05 DIAGNOSIS — I10 Essential (primary) hypertension: Secondary | ICD-10-CM

## 2024-08-06 ENCOUNTER — Telehealth: Payer: Self-pay

## 2024-08-06 NOTE — Progress Notes (Signed)
" ° °  08/06/2024  Patient ID: Laura Miller, female   DOB: [birth year], 39 y.o.   MRN: [id number]  Contacted [facility] to check on processing of norethindrone  0.35mg  daily.  Pharmacy did not have patient's current medication insurance on file.  I was able to provide processing information, and prescription is now going through for $0 copay.  Sending patient a MyChart message to make her aware.  [name], PharmD, DPLA  "

## 2024-08-07 ENCOUNTER — Other Ambulatory Visit: Payer: Self-pay | Admitting: Nurse Practitioner

## 2024-08-07 DIAGNOSIS — I10 Essential (primary) hypertension: Secondary | ICD-10-CM

## 2024-08-07 DIAGNOSIS — E1165 Type 2 diabetes mellitus with hyperglycemia: Secondary | ICD-10-CM

## 2024-09-12 ENCOUNTER — Ambulatory Visit: Payer: PRIVATE HEALTH INSURANCE | Admitting: Nurse Practitioner

## 2024-09-17 ENCOUNTER — Ambulatory Visit: Payer: PRIVATE HEALTH INSURANCE | Admitting: Nurse Practitioner
